# Patient Record
Sex: Female | Born: 1975
Health system: Southern US, Community
[De-identification: ages and names within clinical notes are randomized; demographics above are authoritative.]

## PROBLEM LIST (undated history)

## (undated) DIAGNOSIS — E119 Type 2 diabetes mellitus without complications: Secondary | ICD-10-CM

---

## 1998-07-10 ENCOUNTER — Ambulatory Visit (HOSPITAL_COMMUNITY): Admission: RE | Admit: 1998-07-10 | Discharge: 1998-07-10 | Payer: Self-pay | Admitting: Obstetrics

## 1998-11-15 ENCOUNTER — Inpatient Hospital Stay (HOSPITAL_COMMUNITY): Admission: AD | Admit: 1998-11-15 | Discharge: 1998-11-17 | Payer: Self-pay | Admitting: *Deleted

## 1998-12-08 ENCOUNTER — Inpatient Hospital Stay (HOSPITAL_COMMUNITY): Admission: AD | Admit: 1998-12-08 | Discharge: 1998-12-08 | Payer: Self-pay | Admitting: Obstetrics

## 2019-10-19 DIAGNOSIS — R05 Cough: Secondary | ICD-10-CM

## 2019-10-19 DIAGNOSIS — R059 Cough, unspecified: Secondary | ICD-10-CM

## 2019-10-19 DIAGNOSIS — K8020 Calculus of gallbladder without cholecystitis without obstruction: Secondary | ICD-10-CM

## 2019-10-19 HISTORY — DX: Cough: R05

## 2019-10-19 HISTORY — DX: Calculus of gallbladder without cholecystitis without obstruction: K80.20

## 2019-10-19 HISTORY — DX: Cough, unspecified: R05.9

## 2019-10-25 ENCOUNTER — Inpatient Hospital Stay (HOSPITAL_COMMUNITY)
Admission: EM | Admit: 2019-10-25 | Discharge: 2019-11-02 | DRG: 853 | Disposition: A | Discharge status: Home / Self Care | Payer: Self-pay | Attending: General Surgery | Admitting: General Surgery

## 2019-10-25 ENCOUNTER — Other Ambulatory Visit: Payer: Self-pay

## 2019-10-25 ENCOUNTER — Encounter (HOSPITAL_COMMUNITY): Payer: Self-pay | Admitting: Emergency Medicine

## 2019-10-25 DIAGNOSIS — D500 Iron deficiency anemia secondary to blood loss (chronic): Secondary | ICD-10-CM | POA: Diagnosis present

## 2019-10-25 DIAGNOSIS — E1165 Type 2 diabetes mellitus with hyperglycemia: Secondary | ICD-10-CM | POA: Diagnosis present

## 2019-10-25 DIAGNOSIS — Z6838 Body mass index (BMI) 38.0-38.9, adult: Secondary | ICD-10-CM

## 2019-10-25 DIAGNOSIS — M726 Necrotizing fasciitis: Secondary | ICD-10-CM | POA: Diagnosis present

## 2019-10-25 DIAGNOSIS — K8050 Calculus of bile duct without cholangitis or cholecystitis without obstruction: Secondary | ICD-10-CM

## 2019-10-25 DIAGNOSIS — A409 Streptococcal sepsis, unspecified: Principal | ICD-10-CM | POA: Diagnosis present

## 2019-10-25 DIAGNOSIS — E669 Obesity, unspecified: Secondary | ICD-10-CM | POA: Diagnosis present

## 2019-10-25 DIAGNOSIS — D509 Iron deficiency anemia, unspecified: Secondary | ICD-10-CM | POA: Diagnosis present

## 2019-10-25 DIAGNOSIS — E876 Hypokalemia: Secondary | ICD-10-CM | POA: Diagnosis not present

## 2019-10-25 DIAGNOSIS — K8070 Calculus of gallbladder and bile duct without cholecystitis without obstruction: Secondary | ICD-10-CM | POA: Diagnosis present

## 2019-10-25 DIAGNOSIS — R059 Cough, unspecified: Secondary | ICD-10-CM

## 2019-10-25 DIAGNOSIS — E871 Hypo-osmolality and hyponatremia: Secondary | ICD-10-CM | POA: Diagnosis present

## 2019-10-25 DIAGNOSIS — I96 Gangrene, not elsewhere classified: Secondary | ICD-10-CM

## 2019-10-25 DIAGNOSIS — N393 Stress incontinence (female) (male): Secondary | ICD-10-CM | POA: Diagnosis present

## 2019-10-25 DIAGNOSIS — L02215 Cutaneous abscess of perineum: Secondary | ICD-10-CM | POA: Diagnosis present

## 2019-10-25 DIAGNOSIS — K219 Gastro-esophageal reflux disease without esophagitis: Secondary | ICD-10-CM | POA: Diagnosis present

## 2019-10-25 DIAGNOSIS — Z9114 Patient's other noncompliance with medication regimen: Secondary | ICD-10-CM

## 2019-10-25 DIAGNOSIS — Z20828 Contact with and (suspected) exposure to other viral communicable diseases: Secondary | ICD-10-CM | POA: Diagnosis present

## 2019-10-25 DIAGNOSIS — L03116 Cellulitis of left lower limb: Secondary | ICD-10-CM | POA: Diagnosis present

## 2019-10-25 DIAGNOSIS — R05 Cough: Secondary | ICD-10-CM

## 2019-10-25 DIAGNOSIS — Z713 Dietary counseling and surveillance: Secondary | ICD-10-CM

## 2019-10-25 DIAGNOSIS — E119 Type 2 diabetes mellitus without complications: Secondary | ICD-10-CM

## 2019-10-25 DIAGNOSIS — A419 Sepsis, unspecified organism: Secondary | ICD-10-CM | POA: Diagnosis present

## 2019-10-25 DIAGNOSIS — K8020 Calculus of gallbladder without cholecystitis without obstruction: Secondary | ICD-10-CM

## 2019-10-25 DIAGNOSIS — J189 Pneumonia, unspecified organism: Secondary | ICD-10-CM | POA: Diagnosis not present

## 2019-10-25 HISTORY — DX: Type 2 diabetes mellitus without complications: E11.9

## 2019-10-25 LAB — CBC
HCT: 31.4 % — ABNORMAL LOW (ref 36.0–46.0)
Hemoglobin: 9.5 g/dL — ABNORMAL LOW (ref 12.0–15.0)
MCH: 22.9 pg — ABNORMAL LOW (ref 26.0–34.0)
MCHC: 30.3 g/dL (ref 30.0–36.0)
MCV: 75.7 fL — ABNORMAL LOW (ref 80.0–100.0)
Platelets: 348 10*3/uL (ref 150–400)
RBC: 4.15 MIL/uL (ref 3.87–5.11)
RDW: 17.4 % — ABNORMAL HIGH (ref 11.5–15.5)
WBC: 22 10*3/uL — ABNORMAL HIGH (ref 4.0–10.5)
nRBC: 0 % (ref 0.0–0.2)

## 2019-10-25 LAB — BASIC METABOLIC PANEL
Anion gap: 13 (ref 5–15)
BUN: 13 mg/dL (ref 6–20)
CO2: 22 mmol/L (ref 22–32)
Calcium: 8.7 mg/dL — ABNORMAL LOW (ref 8.9–10.3)
Chloride: 96 mmol/L — ABNORMAL LOW (ref 98–111)
Creatinine, Ser: 0.68 mg/dL (ref 0.44–1.00)
GFR calc Af Amer: >60 mL/min (ref >60)
GFR calc non Af Amer: >60 mL/min (ref >60)
Glucose, Bld: 320 mg/dL — ABNORMAL HIGH (ref 70–99)
Potassium: 3.8 mmol/L (ref 3.5–5.1)
Sodium: 131 mmol/L — ABNORMAL LOW (ref 135–145)

## 2019-10-25 LAB — I-STAT BETA HCG BLOOD, ED (MC, WL, AP ONLY): I-stat hCG, quantitative: <5 m[IU]/mL (ref <5)

## 2019-10-25 NOTE — ED Triage Notes (Signed)
Patient with nausea and vomiting and high blood sugar.  Patient has Type II diabetes, she is also having some dizziness when standing.  Patient with pain in her abdomen.

## 2019-10-26 ENCOUNTER — Encounter (HOSPITAL_COMMUNITY): Payer: Self-pay | Admitting: Radiology

## 2019-10-26 ENCOUNTER — Observation Stay (HOSPITAL_COMMUNITY): Payer: Self-pay | Admitting: Critical Care Medicine

## 2019-10-26 ENCOUNTER — Encounter (HOSPITAL_COMMUNITY): Admission: EM | Disposition: A | Discharge status: Home / Self Care | Payer: Self-pay | Source: Home / Self Care

## 2019-10-26 ENCOUNTER — Emergency Department (HOSPITAL_COMMUNITY): Payer: Self-pay

## 2019-10-26 ENCOUNTER — Other Ambulatory Visit: Payer: Self-pay

## 2019-10-26 DIAGNOSIS — M726 Necrotizing fasciitis: Secondary | ICD-10-CM

## 2019-10-26 DIAGNOSIS — E119 Type 2 diabetes mellitus without complications: Secondary | ICD-10-CM

## 2019-10-26 HISTORY — PX: INCISION AND DRAINAGE PERIRECTAL ABSCESS: SHX1804

## 2019-10-26 LAB — GLUCOSE, CAPILLARY
Glucose-Capillary: 293 mg/dL — ABNORMAL HIGH (ref 70–99)
Glucose-Capillary: 290 mg/dL — ABNORMAL HIGH (ref 70–99)
Glucose-Capillary: 322 mg/dL — ABNORMAL HIGH (ref 70–99)
Glucose-Capillary: 286 mg/dL — ABNORMAL HIGH (ref 70–99)

## 2019-10-26 LAB — CBG MONITORING, ED: Glucose-Capillary: 339 mg/dL — ABNORMAL HIGH (ref 70–99)

## 2019-10-26 LAB — URINALYSIS, ROUTINE W REFLEX MICROSCOPIC
Bilirubin Urine: NEGATIVE
Glucose, UA: >=500 mg/dL — AB
Ketones, ur: 80 mg/dL — AB
Leukocytes,Ua: NEGATIVE
Nitrite: NEGATIVE
Protein, ur: 30 mg/dL — AB
Specific Gravity, Urine: >1.046 — ABNORMAL HIGH (ref 1.005–1.030)
pH: 5 (ref 5.0–8.0)

## 2019-10-26 LAB — HEMOGLOBIN A1C
Hgb A1c MFr Bld: 11.8 % — ABNORMAL HIGH (ref 4.8–5.6)
Mean Plasma Glucose: 291.96 mg/dL

## 2019-10-26 LAB — HEPATIC FUNCTION PANEL
ALT: 21 U/L (ref 0–44)
AST: 53 U/L — ABNORMAL HIGH (ref 15–41)
Albumin: 3 g/dL — ABNORMAL LOW (ref 3.5–5.0)
Alkaline Phosphatase: 102 U/L (ref 38–126)
Bilirubin, Direct: 0.6 mg/dL — ABNORMAL HIGH (ref 0.0–0.2)
Indirect Bilirubin: 1.1 mg/dL — ABNORMAL HIGH (ref 0.3–0.9)
Total Bilirubin: 1.7 mg/dL — ABNORMAL HIGH (ref 0.3–1.2)
Total Protein: 6.9 g/dL (ref 6.5–8.1)

## 2019-10-26 LAB — RESPIRATORY PANEL BY RT PCR (FLU A&B, COVID)
Influenza A by PCR: NEGATIVE
Influenza B by PCR: NEGATIVE
SARS Coronavirus 2 by RT PCR: NEGATIVE

## 2019-10-26 LAB — HIV ANTIBODY (ROUTINE TESTING W REFLEX): HIV Screen 4th Generation wRfx: NONREACTIVE

## 2019-10-26 LAB — LACTIC ACID, PLASMA
Lactic Acid, Venous: 2.7 mmol/L (ref 0.5–1.9)
Lactic Acid, Venous: 2 mmol/L (ref 0.5–1.9)

## 2019-10-26 LAB — LIPASE, BLOOD: Lipase: 17 U/L (ref 11–51)

## 2019-10-26 SURGERY — INCISION AND DRAINAGE, ABSCESS, PERIRECTAL
Anesthesia: General | Site: Groin | Laterality: Left

## 2019-10-26 MED ORDER — DIPHENHYDRAMINE HCL 25 MG PO CAPS: 25.0000 mg | ORAL_CAPSULE | Freq: Four times a day (QID) | ORAL | Status: DC | PRN

## 2019-10-26 MED ORDER — PHENYLEPHRINE 40 MCG/ML (10ML) SYRINGE FOR IV PUSH (FOR BLOOD PRESSURE SUPPORT)
PREFILLED_SYRINGE | INTRAVENOUS | Status: DC | PRN
  Administered 2019-10-26: 80 ug via INTRAVENOUS
  Administered 2019-10-26: 200 ug via INTRAVENOUS
  Administered 2019-10-26 (×2): 80 ug via INTRAVENOUS

## 2019-10-26 MED ORDER — EPHEDRINE 5 MG/ML INJ
INTRAVENOUS | Status: AC
  Filled 2019-10-26: qty 10

## 2019-10-26 MED ORDER — ONDANSETRON HCL 4 MG/2ML IJ SOLN
INTRAMUSCULAR | Status: DC | PRN
  Administered 2019-10-26: 4 mg via INTRAVENOUS

## 2019-10-26 MED ORDER — VANCOMYCIN HCL 10 G IV SOLR
1250.0000 mg | INTRAVENOUS | Status: DC
  Filled 2019-10-26: qty 1250

## 2019-10-26 MED ORDER — ONDANSETRON HCL 4 MG/2ML IJ SOLN
4.0000 mg | Freq: Once | INTRAMUSCULAR | Status: AC
  Administered 2019-10-26: 4 mg via INTRAVENOUS
  Filled 2019-10-26: qty 2

## 2019-10-26 MED ORDER — MIDAZOLAM HCL 2 MG/2ML IJ SOLN
INTRAMUSCULAR | Status: AC
  Filled 2019-10-26: qty 2

## 2019-10-26 MED ORDER — ONDANSETRON HCL 4 MG/2ML IJ SOLN
INTRAMUSCULAR | Status: AC
  Filled 2019-10-26: qty 4

## 2019-10-26 MED ORDER — PIPERACILLIN-TAZOBACTAM 3.375 G IVPB 30 MIN: 3.3750 g | Freq: Three times a day (TID) | INTRAVENOUS | Status: DC

## 2019-10-26 MED ORDER — MORPHINE SULFATE (PF) 2 MG/ML IV SOLN: 1.0000 mg | INTRAVENOUS | Status: DC | PRN

## 2019-10-26 MED ORDER — PHENYLEPHRINE 40 MCG/ML (10ML) SYRINGE FOR IV PUSH (FOR BLOOD PRESSURE SUPPORT)
PREFILLED_SYRINGE | INTRAVENOUS | Status: AC
  Filled 2019-10-26: qty 20

## 2019-10-26 MED ORDER — ACETAMINOPHEN 500 MG PO TABS
1000.0000 mg | ORAL_TABLET | Freq: Once | ORAL | Status: AC
  Administered 2019-10-26: 1000 mg via ORAL
  Filled 2019-10-26: qty 2

## 2019-10-26 MED ORDER — PIPERACILLIN-TAZOBACTAM 3.375 G IVPB
3.3750 g | Freq: Three times a day (TID) | INTRAVENOUS | Status: DC
  Filled 2019-10-26: qty 50

## 2019-10-26 MED ORDER — PHENYLEPHRINE HCL-NACL 10-0.9 MG/250ML-% IV SOLN
INTRAVENOUS | Status: DC | PRN
  Administered 2019-10-26: 50 ug/min via INTRAVENOUS

## 2019-10-26 MED ORDER — SUCCINYLCHOLINE CHLORIDE 200 MG/10ML IV SOSY
PREFILLED_SYRINGE | INTRAVENOUS | Status: AC
  Filled 2019-10-26: qty 20

## 2019-10-26 MED ORDER — PIPERACILLIN-TAZOBACTAM 3.375 G IVPB
3.3750 g | Freq: Three times a day (TID) | INTRAVENOUS | Status: DC
  Administered 2019-10-26 – 2019-11-01 (×19): 3.375 g via INTRAVENOUS
  Filled 2019-10-26 (×16): qty 50

## 2019-10-26 MED ORDER — INSULIN DETEMIR 100 UNIT/ML ~~LOC~~ SOLN
12.0000 [IU] | Freq: Every day | SUBCUTANEOUS | Status: DC
  Administered 2019-10-26: 12 [IU] via SUBCUTANEOUS
  Filled 2019-10-26 (×2): qty 0.12

## 2019-10-26 MED ORDER — PROPOFOL 10 MG/ML IV BOLUS
INTRAVENOUS | Status: AC
  Filled 2019-10-26: qty 20

## 2019-10-26 MED ORDER — VANCOMYCIN HCL IN DEXTROSE 1-5 GM/200ML-% IV SOLN
1000.0000 mg | Freq: Once | INTRAVENOUS | Status: AC
  Administered 2019-10-26: 1000 mg via INTRAVENOUS
  Filled 2019-10-26: qty 200

## 2019-10-26 MED ORDER — OXYCODONE HCL 5 MG PO TABS: 5.0000 mg | ORAL_TABLET | Freq: Once | ORAL | Status: DC | PRN

## 2019-10-26 MED ORDER — ONDANSETRON HCL 4 MG/2ML IJ SOLN
4.0000 mg | Freq: Four times a day (QID) | INTRAMUSCULAR | Status: DC | PRN
  Administered 2019-10-26 – 2019-10-31 (×3): 4 mg via INTRAVENOUS
  Filled 2019-10-26 (×5): qty 2

## 2019-10-26 MED ORDER — SCOPOLAMINE 1 MG/3DAYS TD PT72
1.0000 | MEDICATED_PATCH | Freq: Once | TRANSDERMAL | Status: AC
  Administered 2019-10-26: 1.5 mg via TRANSDERMAL
  Filled 2019-10-26: qty 1

## 2019-10-26 MED ORDER — DEXAMETHASONE SODIUM PHOSPHATE 10 MG/ML IJ SOLN
INTRAMUSCULAR | Status: AC
  Filled 2019-10-26: qty 1

## 2019-10-26 MED ORDER — INSULIN ASPART 100 UNIT/ML ~~LOC~~ SOLN
0.0000 [IU] | Freq: Three times a day (TID) | SUBCUTANEOUS | Status: DC
  Administered 2019-10-26: 18:00:00 7 [IU] via SUBCUTANEOUS
  Administered 2019-10-27: 2 [IU] via SUBCUTANEOUS
  Administered 2019-10-27: 1 [IU] via SUBCUTANEOUS
  Administered 2019-10-27: 09:00:00 3 [IU] via SUBCUTANEOUS
  Administered 2019-10-28 – 2019-11-01 (×9): 1 [IU] via SUBCUTANEOUS
  Administered 2019-11-02: 2 [IU] via SUBCUTANEOUS

## 2019-10-26 MED ORDER — FENTANYL CITRATE (PF) 250 MCG/5ML IJ SOLN
INTRAMUSCULAR | Status: DC | PRN
  Administered 2019-10-26 (×2): 50 ug via INTRAVENOUS
  Administered 2019-10-26: 25 ug via INTRAVENOUS

## 2019-10-26 MED ORDER — SODIUM CHLORIDE 0.9 % IV BOLUS: 1000.0000 mL | Freq: Once | INTRAVENOUS | Status: AC

## 2019-10-26 MED ORDER — LIDOCAINE 2% (20 MG/ML) 5 ML SYRINGE
INTRAMUSCULAR | Status: AC
  Filled 2019-10-26: qty 15

## 2019-10-26 MED ORDER — PROMETHAZINE HCL 25 MG/ML IJ SOLN: 6.2500 mg | INTRAMUSCULAR | Status: DC | PRN

## 2019-10-26 MED ORDER — SODIUM CHLORIDE 0.9 % IV BOLUS
1000.0000 mL | Freq: Once | INTRAVENOUS | Status: AC
  Administered 2019-10-26: 1000 mL via INTRAVENOUS

## 2019-10-26 MED ORDER — FENTANYL CITRATE (PF) 250 MCG/5ML IJ SOLN
INTRAMUSCULAR | Status: AC
  Filled 2019-10-26: qty 5

## 2019-10-26 MED ORDER — MORPHINE SULFATE (PF) 4 MG/ML IV SOLN
4.0000 mg | Freq: Once | INTRAVENOUS | Status: AC
  Administered 2019-10-26: 4 mg via INTRAVENOUS
  Filled 2019-10-26: qty 1

## 2019-10-26 MED ORDER — PIPERACILLIN-TAZOBACTAM 3.375 G IVPB 30 MIN
3.3750 g | Freq: Once | INTRAVENOUS | Status: AC
  Administered 2019-10-26: 3.375 g via INTRAVENOUS
  Filled 2019-10-26: qty 50

## 2019-10-26 MED ORDER — ROCURONIUM BROMIDE 10 MG/ML (PF) SYRINGE
PREFILLED_SYRINGE | INTRAVENOUS | Status: AC
  Filled 2019-10-26: qty 10

## 2019-10-26 MED ORDER — PROPOFOL 10 MG/ML IV BOLUS
INTRAVENOUS | Status: DC | PRN
  Administered 2019-10-26: 150 mg via INTRAVENOUS

## 2019-10-26 MED ORDER — DIPHENHYDRAMINE HCL 50 MG/ML IJ SOLN: 25.0000 mg | Freq: Four times a day (QID) | INTRAMUSCULAR | Status: DC | PRN

## 2019-10-26 MED ORDER — SUCCINYLCHOLINE CHLORIDE 200 MG/10ML IV SOSY
PREFILLED_SYRINGE | INTRAVENOUS | Status: DC | PRN
  Administered 2019-10-26: 120 mg via INTRAVENOUS

## 2019-10-26 MED ORDER — ARTIFICIAL TEARS OPHTHALMIC OINT
TOPICAL_OINTMENT | OPHTHALMIC | Status: AC
  Filled 2019-10-26: qty 3.5

## 2019-10-26 MED ORDER — FENTANYL CITRATE (PF) 100 MCG/2ML IJ SOLN: 25.0000 ug | INTRAMUSCULAR | Status: DC | PRN

## 2019-10-26 MED ORDER — OXYCODONE HCL 5 MG PO TABS
5.0000 mg | ORAL_TABLET | ORAL | Status: DC | PRN
  Administered 2019-10-27 – 2019-10-28 (×2): 5 mg via ORAL
  Filled 2019-10-26 (×2): qty 1
  Filled 2019-10-26: qty 2
  Filled 2019-10-26: qty 1

## 2019-10-26 MED ORDER — GLYCOPYRROLATE PF 0.2 MG/ML IJ SOSY
PREFILLED_SYRINGE | INTRAMUSCULAR | Status: AC
  Filled 2019-10-26: qty 1

## 2019-10-26 MED ORDER — INSULIN ASPART 100 UNIT/ML ~~LOC~~ SOLN
5.0000 [IU] | Freq: Once | SUBCUTANEOUS | Status: AC
  Administered 2019-10-26: 13:00:00 5 [IU] via SUBCUTANEOUS

## 2019-10-26 MED ORDER — METHOCARBAMOL 500 MG PO TABS
500.0000 mg | ORAL_TABLET | Freq: Three times a day (TID) | ORAL | Status: DC | PRN
  Administered 2019-10-27 – 2019-10-28 (×2): 500 mg via ORAL
  Filled 2019-10-26 (×3): qty 1

## 2019-10-26 MED ORDER — ONDANSETRON 4 MG PO TBDP: 4.0000 mg | ORAL_TABLET | Freq: Four times a day (QID) | ORAL | Status: DC | PRN

## 2019-10-26 MED ORDER — IOHEXOL 300 MG/ML  SOLN
100.0000 mL | Freq: Once | INTRAMUSCULAR | Status: AC | PRN
  Administered 2019-10-26: 100 mL via INTRAVENOUS

## 2019-10-26 MED ORDER — OXYCODONE HCL 5 MG/5ML PO SOLN: 5.0000 mg | Freq: Once | ORAL | Status: DC | PRN

## 2019-10-26 MED ORDER — INSULIN ASPART 100 UNIT/ML ~~LOC~~ SOLN
SUBCUTANEOUS | Status: AC
  Administered 2019-10-26: 5 [IU] via SUBCUTANEOUS
  Filled 2019-10-26: qty 1

## 2019-10-26 MED ORDER — 0.9 % SODIUM CHLORIDE (POUR BTL) OPTIME
TOPICAL | Status: DC | PRN
  Administered 2019-10-26: 1000 mL

## 2019-10-26 MED ORDER — MORPHINE SULFATE (PF) 2 MG/ML IV SOLN
1.0000 mg | INTRAVENOUS | Status: DC | PRN
  Administered 2019-10-26: 2 mg via INTRAVENOUS
  Filled 2019-10-26: qty 1

## 2019-10-26 MED ORDER — ACETAMINOPHEN 325 MG PO TABS
650.0000 mg | ORAL_TABLET | Freq: Four times a day (QID) | ORAL | Status: DC | PRN
  Administered 2019-10-27: 650 mg via ORAL
  Filled 2019-10-26: qty 2

## 2019-10-26 MED ORDER — LACTATED RINGERS IV SOLN
INTRAVENOUS | Status: DC
  Administered 2019-10-26: 11:00:00 via INTRAVENOUS

## 2019-10-26 MED ORDER — INSULIN ASPART 100 UNIT/ML ~~LOC~~ SOLN
5.0000 [IU] | Freq: Once | SUBCUTANEOUS | Status: AC
  Administered 2019-10-26: 5 [IU] via SUBCUTANEOUS

## 2019-10-26 MED ORDER — SODIUM CHLORIDE 0.9 % IV SOLN
INTRAVENOUS | Status: DC
  Administered 2019-10-26: 14:00:00 via INTRAVENOUS

## 2019-10-26 MED ORDER — LIDOCAINE 2% (20 MG/ML) 5 ML SYRINGE
INTRAMUSCULAR | Status: DC | PRN
  Administered 2019-10-26: 60 mg via INTRAVENOUS

## 2019-10-26 MED ORDER — MIDAZOLAM HCL 5 MG/5ML IJ SOLN
INTRAMUSCULAR | Status: DC | PRN
  Administered 2019-10-26 (×2): 1 mg via INTRAVENOUS

## 2019-10-26 MED ORDER — LACTATED RINGERS IV BOLUS
1000.0000 mL | Freq: Once | INTRAVENOUS | Status: AC
  Administered 2019-10-26: 08:00:00 1000 mL via INTRAVENOUS

## 2019-10-26 SURGICAL SUPPLY — 31 items
BNDG GAUZE ELAST 4 BULKY (GAUZE/BANDAGES/DRESSINGS) IMPLANT
COVER MAYO STAND STRL (DRAPES) ×3 IMPLANT
COVER SURGICAL LIGHT HANDLE (MISCELLANEOUS) ×3 IMPLANT
COVER WAND RF STERILE (DRAPES) ×3 IMPLANT
ELECT CAUTERY BLADE 6.4 (BLADE) ×3 IMPLANT
ELECT REM PT RETURN 9FT ADLT (ELECTROSURGICAL) ×3
ELECTRODE REM PT RTRN 9FT ADLT (ELECTROSURGICAL) ×1 IMPLANT
GAUZE SPONGE 4X4 12PLY STRL (GAUZE/BANDAGES/DRESSINGS) ×3 IMPLANT
GLOVE BIO SURGEON STRL SZ7 (GLOVE) ×3 IMPLANT
GLOVE BIOGEL PI IND STRL 7.5 (GLOVE) ×1 IMPLANT
GLOVE BIOGEL PI INDICATOR 7.5 (GLOVE) ×2
GOWN STRL REUS W/ TWL LRG LVL3 (GOWN DISPOSABLE) ×3 IMPLANT
GOWN STRL REUS W/TWL LRG LVL3 (GOWN DISPOSABLE) ×6
KIT BASIN OR (CUSTOM PROCEDURE TRAY) ×3 IMPLANT
KIT TURNOVER KIT B (KITS) ×3 IMPLANT
NS IRRIG 1000ML POUR BTL (IV SOLUTION) ×3 IMPLANT
PACK GENERAL/GYN (CUSTOM PROCEDURE TRAY) ×3 IMPLANT
PACK LITHOTOMY IV (CUSTOM PROCEDURE TRAY) IMPLANT
PAD ARMBOARD 7.5X6 YLW CONV (MISCELLANEOUS) ×3 IMPLANT
PENCIL SMOKE EVACUATOR (MISCELLANEOUS) ×3 IMPLANT
SPONGE LAP 18X18 RF (DISPOSABLE) ×3 IMPLANT
SURGILUBE 2OZ TUBE FLIPTOP (MISCELLANEOUS) ×3 IMPLANT
SWAB CULTURE LIQ STUART DBL (MISCELLANEOUS) ×3 IMPLANT
SWAB CULTURE LIQUID MINI MALE (MISCELLANEOUS) ×3 IMPLANT
SYR BULB 3OZ (MISCELLANEOUS) IMPLANT
THERMADRAPE LEGGINGS (DRAPES) ×3 IMPLANT
TOWEL GREEN STERILE (TOWEL DISPOSABLE) ×3 IMPLANT
TOWEL GREEN STERILE FF (TOWEL DISPOSABLE) ×3 IMPLANT
TUBE CONNECTING 12'X1/4 (SUCTIONS) ×1
TUBE CONNECTING 12X1/4 (SUCTIONS) ×2 IMPLANT
YANKAUER SUCT BULB TIP NO VENT (SUCTIONS) ×3 IMPLANT

## 2019-10-26 NOTE — Transfer of Care (Signed)
Immediate Anesthesia Transfer of Care Note  Patient: Morgan Williamson  Procedure(s) Performed: INCISION AND DRAINAGE GROIN (Left Groin)  Patient Location: PACU  Anesthesia Type:General  Level of Consciousness: awake and alert   Airway & Oxygen Therapy: Patient Spontanous Breathing and Patient connected to nasal cannula oxygen  Post-op Assessment: Report given to RN and Post -op Vital signs reviewed and stable  Post vital signs: Reviewed and stable  Last Vitals:  Vitals Value Taken Time  BP 89/62 10/26/19 1226  Temp    Pulse 111 10/26/19 1226  Resp 20 10/26/19 1226  SpO2 93 % 10/26/19 1226    Last Pain:  Vitals:   10/26/19 0915  TempSrc:   PainSc: Asleep         Complications: No apparent anesthesia complications

## 2019-10-26 NOTE — Progress Notes (Signed)
Pt admitted to 6N26 from PACU.  Assisted to BR and pt was able to void.  SCDs placed.

## 2019-10-26 NOTE — Progress Notes (Signed)
Pharmacy Antibiotic Note  Morgan Williamson is a 43 y.o. female admitted on 10/25/2019 with necrotizing fascitis of left groin.  Pharmacy has been consulted for Vancomycin  dosing.  S/p I + D this AM Vancomycin 1 gram given at 7 am Also, receiving Zosyn  Plan: Vancomycin 1250 mg iv Q 24 hours starting 12/9 AM Follow up Scr, cultures, progress  Height: 4\' 9"  (144.8 cm) Weight: 180 lb (81.6 kg) IBW/kg (Calculated) : 38.6  Temp (24hrs), Avg:99.6 F (37.6 C), Min:99 F (37.2 C), Max:100.3 F (37.9 C)  Recent Labs  Lab 10/25/19 2205 10/26/19 0454 10/26/19 0911  WBC 22.0*  --   --   CREATININE 0.68  --   --   LATICACIDVEN  --  2.7* 2.0*    Estimated Creatinine Clearance: 81.5 mL/min (by C-G formula based on SCr of 0.68 mg/dL).    No Known Allergies  Thank you for allowing pharmacy to be a part of this patient's care.  Tad Moore 10/26/2019 2:08 PM

## 2019-10-26 NOTE — Anesthesia Procedure Notes (Signed)
Procedure Name: Intubation Date/Time: 10/26/2019 11:44 AM Performed by: Wilburn Cornelia, CRNA Pre-anesthesia Checklist: Emergency Drugs available, Patient identified, Suction available, Patient being monitored and Timeout performed Patient Re-evaluated:Patient Re-evaluated prior to induction Oxygen Delivery Method: Circle system utilized Preoxygenation: Pre-oxygenation with 100% oxygen Induction Type: IV induction and Rapid sequence Laryngoscope Size: Mac and 3 Grade View: Grade I Tube type: Oral Tube size: 7.0 mm Number of attempts: 1 Airway Equipment and Method: Stylet Placement Confirmation: ETT inserted through vocal cords under direct vision,  positive ETCO2,  CO2 detector and breath sounds checked- equal and bilateral Secured at: 21 cm Tube secured with: Tape Dental Injury: Teeth and Oropharynx as per pre-operative assessment

## 2019-10-26 NOTE — ED Notes (Signed)
Triage RN made aware of pts b/p 

## 2019-10-26 NOTE — Anesthesia Preprocedure Evaluation (Addendum)
Anesthesia Evaluation  Patient identified by MRN, date of birth, ID band Patient awake    Reviewed: Allergy & Precautions, NPO status , Patient's Chart, lab work & pertinent test results  History of Anesthesia Complications Negative for: history of anesthetic complications  Airway Mallampati: II  TM Distance: >3 FB Neck ROM: Full    Dental  (+) Dental Advisory Given, Teeth Intact   Pulmonary neg pulmonary ROS,    Pulmonary exam normal        Cardiovascular negative cardio ROS Normal cardiovascular exam     Neuro/Psych negative neurological ROS  negative psych ROS   GI/Hepatic Neg liver ROS,   Endo/Other  diabetes, Poorly Controlled, Type 2Morbid obesity Hyponatremia   Renal/GU negative Renal ROS  negative genitourinary   Musculoskeletal  Necrotizing fasciitis of groin   Abdominal   Peds  Hematology  (+) anemia , Hgb 9.5   Anesthesia Other Findings   Reproductive/Obstetrics negative OB ROS                           Anesthesia Physical Anesthesia Plan  ASA: III  Anesthesia Plan: General   Post-op Pain Management:    Induction: Intravenous and Rapid sequence  PONV Risk Score and Plan: 4 or greater and Treatment may vary due to age or medical condition, Ondansetron, Dexamethasone, Midazolam and Scopolamine patch - Pre-op  Airway Management Planned: Oral ETT  Additional Equipment: None  Intra-op Plan:   Post-operative Plan: Extubation in OR  Informed Consent: I have reviewed the patients History and Physical, chart, labs and discussed the procedure including the risks, benefits and alternatives for the proposed anesthesia with the patient or authorized representative who has indicated his/her understanding and acceptance.     Dental advisory given  Plan Discussed with: CRNA and Anesthesiologist  Anesthesia Plan Comments:       Anesthesia Quick Evaluation

## 2019-10-26 NOTE — Significant Event (Signed)
Rapid Response Event Note  Overview: Time Called: 1546 Event Type: MEWS  Initial Focused Assessment: Called to bedside for MEWs score related to patient's HR and BP. Patient tachycardic at and BP 100/69. Patient is lethargic, drowsy. Patient just arrived from PACU following I&D. Patient had received 2mg  Morphine at 1354.   Upon my assessment, patient had completed 1L bolus. Patient was awake, alert, lying in bed. BP 117/69, HR 121. Lungs auscultated and clear in all quadrants.  Interventions: Dr. Linda Hedges at bedside to assess patient and ordered 1L bolus.  Primary RN also administered PRN Tylenol for pain.  Plan of Care (if not transferred): I spoke with Noralee Stain., RN. She states that since administration of the 1L bolus, patient looks better and her vital signs have improved. HR remains tachycardic at 115 bpm, RN to continue to monitor per MEWs protocol.  Event Summary:  Morgan Williamson

## 2019-10-26 NOTE — Consult Note (Addendum)
[facility] Medical Consultation  Morgan Williamson BSJ:[id number] DOB: Aug 13, 1976 DOA: 10/25/2019 PCP: System, Pcp Not In   Requesting physician: Morgan Williamson Date of consultation: 10/26/2019 Reason for consultation: medical management/diabetes  Impression/Recommendations Active Problems:   Necrotizing fasciitis ([place])    1. Necrotzing fasciitis - patient s/p debridement of perineal abscess left. Care of wound per GS 2. DM - not taking meds. A1C 11.8%. Continue basal insulin and sliding scale. Will need outpatient f/u: IM clinic or [facility] Department Primary care clinic.   I will followup again tomorrow. Please contact me if I can be of assistance in the meanwhile. Thank you for this consultation.  Chief Complaint: Abd pain with N/V  HPI:  Morgan Williamson is a 43 y/o presenting with  RUQ abdominal pain x1 day and L groin "boil" x1 week. She reports associated n/v and constipation, but no fevers at home. She is actively vomiting at the time of my exam. She carries a diagnosis of diabetes, but reports not taking her medications which she describes as pills only, no insulin. History is obtained using a spanish interpreter. Her evaluation to date includes CT abd/pelvis with cholelithiasis but no cholecystitis, unremarkable liver functions, lipase. CBC with leukocytosis of 22. She has undergone I&D and excision of perineal abscess left.  Review of Systems:  CV- denies c/p Resp - no SOB, no cough GI - continue mild nausea, minimal pain, no diarrhea Gyn- no c/o MSK - no joint pain or problems Neuro - no loss of sensation, no change in mentation Derm - per HPI  Past Medical History:  Diagnosis Date  . Diabetes mellitus without complication ([place])    History reviewed. No pertinent surgical history.   Social History:  has no history on file for tobacco, alcohol, and drug.  Spanish speaking immigrant. Lives with husband and two children. Husband is employed. She is full-time Materials engineer.  No  Known Allergies Family History  Problem Relation Age of Onset  . Diabetes Mother     Prior to Admission medications   Not on File   Physical Exam: Blood pressure 108/71, pulse (!) 117, temperature 99 F (37.2 C), resp. rate 20, height 4\' 9"  (1.448 m), weight 81.6 kg, SpO2 94 %. Vitals:   10/26/19 1257 10/26/19 1312  BP: (!) 101/55 108/71  Pulse: (!) 116 (!) 117  Resp: (!) 22 20  Temp:  99 F (37.2 C)  SpO2: 92% 94%     General:  Obese woman who is somnolent but able to stay awake for exam  Eyes: C&S clear, PERRLA  ENT: No oral lesions, native dentition in good repair  Neck: no thyromegaly  Cardiovascular: 2+ radial and DP pulse, quiet precordium, RRR, w/o mm/r/g  Respiratory: Normal respirations. Lungs CTAP  Abdomen: Obese, soft, no HSM, hypoactive BS, no guarding or rebound  Skin: Left perineum below vulva with dry dresing in place. No surrounding erythema  Musculoskeletal: No deformity  Psychiatric: somnolent. Calm  Neurologic: CN II-XII grossly normal  Labs on Admission: reviewed Basic Metabolic Panel: Recent Labs  Lab 10/25/19 2205  NA 131*  K 3.8  CL 96*  CO2 22  GLUCOSE 320*  BUN 13  CREATININE 0.68  CALCIUM 8.7*   Liver Function Tests: Recent Labs  Lab 10/26/19 0455  AST 53*  ALT 21  ALKPHOS 102  BILITOT 1.7*  PROT 6.9  ALBUMIN 3.0*   Recent Labs  Lab 10/26/19 0455  LIPASE 17   No results for input(s): AMMONIA in the last 168 hours. CBC:  Recent Labs  Lab 10/25/19 2205  WBC 22.0*  HGB 9.5*  HCT 31.4*  MCV 75.7*  PLT 348   Cardiac Enzymes: No results for input(s): CKTOTAL, CKMB, CKMBINDEX, TROPONINI in the last 168 hours. BNP: Invalid input(s): POCBNP CBG: Recent Labs  Lab 10/26/19 0818 10/26/19 1009 10/26/19 1233  GLUCAP 339* 293* 290*    Radiological Exams on Admission: Ct Abdomen Pelvis W Contrast  Result Date: 10/26/2019 CLINICAL DATA:  Acute generalized abdominal pain EXAM: CT ABDOMEN AND PELVIS WITH  CONTRAST TECHNIQUE: Multidetector CT imaging of the abdomen and pelvis was performed using the standard protocol following bolus administration of intravenous contrast. CONTRAST:  OMNIPAQUE IOHEXOL 300 MG/ML  SOLN COMPARISON:  None. FINDINGS: Lower chest:  No contributory findings. Hepatobiliary: No focal liver abnormality.Cholelithiasis and gallbladder sludge. No evidence of gallbladder inflammation. No bile duct dilatation. Pancreas: Unremarkable. Spleen: Unremarkable. Adrenals/Urinary Tract: Negative adrenals. No hydronephrosis or stone. Unremarkable bladder. Stomach/Bowel:  No obstruction. No appendicitis. Vascular/Lymphatic: No acute vascular abnormality. No mass or adenopathy. Reproductive:No pathologic findings. Other: No ascites or pneumoperitoneum. Soft tissue gas in the visualized left deep groin fat with tracking along the inguinal canal and into the mons pubis/labia, incompletely covered. No organized collection. Musculoskeletal: No acute abnormalities. These results were called by telephone at the time of interpretation on 10/26/2019 at 5:48 am to provider [facility] , who verbally acknowledged these results. IMPRESSION: 1. Partially covered soft tissue gas in the deep fat of the left groin and perineum compatible with necrotizing infection. No visualized abscess ; there is incomplete coverage. 2. Cholelithiasis without signs of cholecystitis. Electronically Signed   By: Marnee Spring M.D.   On: 10/26/2019 05:49    EKG: Independently reviewed. No EKG on file  Time spent: 55 min  Morgan Williamson [facility] Pager [phone]  If 7PM-7AM, please contact night-coverage [email or web address] Password [email or web address] 10/26/2019, 3:09 PM

## 2019-10-26 NOTE — H&P (Signed)
Reason for Consult: necrotizing soft tissue infection of left groin Referring Physician: Wilkie Aye, MD  Morgan Williamson is an 43 y.o. female.   HPI: 38F p/w RUQ abdominal pain x1 day and L groin "boil" x1 week. She reports associated n/v and constipation, but no fevers at home. She is actively vomiting at the time of my exam. She carries a diagnosis of diabetes, but reports not taking her medications which she describes as pills only, no insulin. History is obtained using a spanish interpreter.   Past Medical History:  Diagnosis Date  . Diabetes mellitus without complication ([facility])     History reviewed. No pertinent surgical history.  No family history on file.  Social History:  has no history on file for tobacco, alcohol, and drug.  Allergies: No Known Allergies  Medications: I have reviewed the patient's current medications.  Results for orders placed or performed during the hospital encounter of 10/25/19 (from the past 48 hour(s))  Basic metabolic panel     Status: Abnormal   Collection Time: 10/25/19 10:05 PM  Result Value Ref Range   Sodium 131 (L) 135 - 145 mmol/L   Potassium 3.8 3.5 - 5.1 mmol/L   Chloride 96 (L) 98 - 111 mmol/L   CO2 22 22 - 32 mmol/L   Glucose, Bld 320 (H) 70 - 99 mg/dL   BUN 13 6 - 20 mg/dL   Creatinine, Ser 8.54 0.44 - 1.00 mg/dL   Calcium 8.7 (L) 8.9 - 10.3 mg/dL   GFR calc non Af Amer >60 >60 mL/min   GFR calc Af Amer >60 >60 mL/min   Anion gap 13 5 - 15    Comment: Performed at [facility], [address], [place], [place] [address]  CBC     Status: Abnormal   Collection Time: 10/25/19 10:05 PM  Result Value Ref Range   WBC 22.0 (H) 4.0 - 10.5 K/uL   RBC 4.15 3.87 - 5.11 MIL/uL   Hemoglobin 9.5 (L) 12.0 - 15.0 g/dL   HCT 50.0 (L) 93.8 - 18.2 %   MCV 75.7 (L) 80.0 - 100.0 fL   MCH 22.9 (L) 26.0 - 34.0 pg   MCHC 30.3 30.0 - 36.0 g/dL   RDW 99.3 (H) 71.6 - 96.7 %   Platelets 348 150 - 400 K/uL   nRBC 0.0 0.0 - 0.2 %    Comment:  Performed at [facility], [address] [place], [place] [address]  I-Stat beta hCG blood, ED     Status: None   Collection Time: 10/25/19 10:19 PM  Result Value Ref Range   I-stat hCG, quantitative <5.0 <5 mIU/mL   Comment 3            Comment:   GEST. AGE      CONC.  (mIU/mL)   <=1 WEEK        5 - 50     2 WEEKS       50 - 500     3 WEEKS       100 - 10,000     4 WEEKS     1,000 - 30,000        FEMALE AND NON-PREGNANT FEMALE:     LESS THAN 5 mIU/mL   Lactic acid, plasma     Status: Abnormal   Collection Time: 10/26/19  4:54 AM  Result Value Ref Range   Lactic Acid, Venous 2.7 (HH) 0.5 - 1.9 mmol/L    Comment:  CRITICAL RESULT CALLED TO, READ BACK BY AND VERIFIED WITH: Margarita Sermons 10/26/19 0543 WAYK Performed at [facility] [address] [place], [place] [address]   Hemoglobin A1c     Status: Abnormal   Collection Time: 10/26/19  4:54 AM  Result Value Ref Range   Hgb A1c MFr Bld 11.8 (H) 4.8 - 5.6 %    Comment: (NOTE) Pre diabetes:          5.7%-6.4% Diabetes:              >6.4% Glycemic control for   <7.0% adults with diabetes    Mean Plasma Glucose 291.96 mg/dL    Comment: Performed at [facility] [address], [place], [place] [address]  Hepatic function panel     Status: Abnormal   Collection Time: 10/26/19  4:55 AM  Result Value Ref Range   Total Protein 6.9 6.5 - 8.1 g/dL   Albumin 3.0 (L) 3.5 - 5.0 g/dL   AST 53 (H) 15 - 41 U/L    Comment: SPECIMEN HEMOLYZED. HEMOLYSIS MAY AFFECT INTEGRITY OF RESULTS.   ALT 21 0 - 44 U/L   Alkaline Phosphatase 102 38 - 126 U/L   Total Bilirubin 1.7 (H) 0.3 - 1.2 mg/dL   Bilirubin, Direct 0.6 (H) 0.0 - 0.2 mg/dL   Indirect Bilirubin 1.1 (H) 0.3 - 0.9 mg/dL    Comment: Performed at [facility] [address], [place], [place] [address]  Lipase, blood     Status: None   Collection Time: 10/26/19  4:55 AM  Result Value Ref Range   Lipase 17 11 - 51 U/L    Comment: Performed at [facility] [address] [place], [place] [address]    Ct Abdomen Pelvis W Contrast  Result Date: 10/26/2019 CLINICAL DATA:  Acute generalized abdominal pain EXAM: CT ABDOMEN AND PELVIS WITH CONTRAST TECHNIQUE: Multidetector CT imaging of the abdomen and pelvis was performed using the standard protocol following bolus administration of intravenous contrast. CONTRAST:  118mL OMNIPAQUE IOHEXOL 300 MG/ML  SOLN COMPARISON:  None. FINDINGS: Lower chest:  No contributory findings. Hepatobiliary: No focal liver abnormality.Cholelithiasis and gallbladder sludge. No evidence of gallbladder inflammation. No bile duct dilatation. Pancreas: Unremarkable. Spleen: Unremarkable. Adrenals/Urinary Tract: Negative adrenals. No hydronephrosis or stone. Unremarkable bladder. Stomach/Bowel:  No obstruction. No appendicitis. Vascular/Lymphatic: No acute vascular abnormality. No mass or adenopathy. Reproductive:No pathologic findings. Other: No ascites or pneumoperitoneum. Soft tissue gas in the visualized left deep groin fat with tracking along the inguinal canal and into the mons pubis/labia, incompletely covered. No organized collection. Musculoskeletal: No acute abnormalities. These results were called by telephone at the time of interpretation on 10/26/2019 at 5:48 am to provider [facility] , who verbally acknowledged these results. IMPRESSION: 1. Partially covered soft tissue gas in the deep fat of the left groin and perineum compatible with necrotizing infection. No visualized abscess ; there is incomplete coverage. 2. Cholelithiasis without signs of cholecystitis. Electronically Signed   By: Monte Fantasia M.D.   On: 10/26/2019 05:49    ROS 10 point review of systems is negative except as listed above in HPI.   Physical Exam Blood pressure 95/66, pulse (!) 118, temperature 99.7 F (37.6 C), temperature source Oral, resp. rate (!) 29, height 4\' 9"  (1.448 m), weight 81.6 kg, SpO2 90 %. Physical Exam Gen: no  distress, mildly lethargic Neuro: non-focal exam HEENT: PERRL Neck: supple CV: RRR Pulm: unlabored breathing Abd: soft,  NT GU: edema, cellulitis, and tenderness of L perineum Extr: wwp, no edema    Assessment/Plan: 40F with necrotizing soft tissue infection of left groin  Imaging, labwork, and clinical exam are supportive of NSTI. Patient has already received vanc/zosyn. Would recommend continuation of broad spectrum abx and urgent operative exploration with aggressive incision and debridement. Risks and benefits were carefully explained to the patient in Spanish using an interpreter (interpreter number listed on consent form), including the possible need for additional operative debridement(s) and informed consent was obtained. Given the location, we also discussed the possible need for debridement of regions involving the GU/reproductive tract and possible urology/gynecology intra-operative consultation, although this is unlikely. The patient was counseled on the importance of compliance with her diabetes medications and verbalized understanding. To OR this AM with Dr. Corliss Skainssuei.   Diamantina MonksAyesha N. Alara Daniel, MD General and Trauma Surgery [facility]

## 2019-10-26 NOTE — ED Provider Notes (Signed)
[facility]   CSN: [id number] Arrival date & time: 10/25/19  2115     History   Chief Complaint Chief Complaint  Patient presents with   Hyperglycemia   Nausea   Emesis    HPI Bluford Maindalia Shough is a 43 y.o. female.     HPI  This is a 43 year old female with history of type 2 diabetes who presents with abdominal pain nausea and vomiting.  Patient reports ongoing epigastric and right upper quadrant pain.  It is worse with eating.  She has noted increased nausea and vomiting.  She has a history of diabetes but has not taken anything for her diabetes in several years.  She was notably hyperglycemic in triage.  She denies any fevers at home.  No shortness of breath, cough, upper respiratory symptoms.  Denies chest pain.  She rates her pain at "20 out of 10."  Last menstrual period was at the end of November.  She does not believe herself to be pregnant.  History obtained with interpreter.  Past Medical History:  Diagnosis Date   Diabetes mellitus without complication (HCC)     There are no active problems to display for this patient.   History reviewed. No pertinent surgical history.   OB History   No obstetric history on file.      Home Medications    Prior to Admission medications   Not on File    Family History No family history on file.  Social History Social History   Tobacco Use   Smoking status: Not on file  Substance Use Topics   Alcohol use: Not on file   Drug use: Not on file     Allergies   Patient has no known allergies.   Review of Systems Review of Systems  Constitutional: Negative for fever.  Respiratory: Negative for shortness of breath.   Cardiovascular: Negative for chest pain.  Gastrointestinal: Positive for abdominal pain, nausea and vomiting. Negative for blood in stool.  Genitourinary: Negative for dysuria.  Neurological: Negative for headaches.  All other systems  reviewed and are negative.    Physical Exam Updated Vital Signs BP 117/67    Pulse (!) 125    Temp 99.7 F (37.6 C) (Oral)    Resp (!) 31    Ht 1.448 m (4\' 9" )    Wt 81.6 kg    SpO2 91%    BMI 38.95 kg/m   Physical Exam Vitals signs and nursing note reviewed.  Constitutional:      Appearance: She is well-developed. She is ill-appearing and diaphoretic.  HENT:     Head: Normocephalic and atraumatic.     Mouth/Throat:     Mouth: Mucous membranes are dry.  Eyes:     Pupils: Pupils are equal, round, and reactive to light.  Neck:     Musculoskeletal: Neck supple.  Cardiovascular:     Rate and Rhythm: Regular rhythm. Tachycardia present.     Heart sounds: Normal heart sounds.  Pulmonary:     Effort: Pulmonary effort is normal. No respiratory distress.     Breath sounds: No wheezing.  Abdominal:     General: Bowel sounds are normal.     Palpations: Abdomen is soft.     Tenderness: There is abdominal tenderness.     Comments: Epigastric and right upper quadrant tenderness to palpation, no rebound or guarding  Genitourinary:    Comments: After receiving radiology results, investigation above morning with fluctuance noted in  the left labia and subtle crepitus into the left inguinal and left thigh area, erythema overlying the region Musculoskeletal:     Right lower leg: No edema.     Left lower leg: No edema.  Skin:    General: Skin is warm.  Neurological:     Mental Status: She is alert and oriented to person, place, and time.  Psychiatric:        Mood and Affect: Mood normal.      ED Treatments / Results  Labs (all labs ordered are listed, but only abnormal results are displayed) Labs Reviewed  BASIC METABOLIC PANEL - Abnormal; Notable for the following components:      Result Value   Sodium 131 (*)    Chloride 96 (*)    Glucose, Bld 320 (*)    Calcium 8.7 (*)    All other components within normal limits  CBC - Abnormal; Notable for the following components:   WBC  22.0 (*)    Hemoglobin 9.5 (*)    HCT 31.4 (*)    MCV 75.7 (*)    MCH 22.9 (*)    RDW 17.4 (*)    All other components within normal limits  LACTIC ACID, PLASMA - Abnormal; Notable for the following components:   Lactic Acid, Venous 2.7 (*)    All other components within normal limits  HEMOGLOBIN A1C - Abnormal; Notable for the following components:   Hgb A1c MFr Bld 11.8 (*)    All other components within normal limits  CULTURE, BLOOD (ROUTINE X 2)  CULTURE, BLOOD (ROUTINE X 2)  RESPIRATORY PANEL BY RT PCR (FLU A&B, COVID)  URINALYSIS, ROUTINE W REFLEX MICROSCOPIC  LACTIC ACID, PLASMA  HEPATIC FUNCTION PANEL  LIPASE, BLOOD  I-STAT BETA HCG BLOOD, ED ([facility], [facility], [facility] ONLY)  CBG MONITORING, ED  CBG MONITORING, ED  CBG MONITORING, ED    EKG None  Radiology Ct Abdomen Pelvis W Contrast  Result Date: 10/26/2019 CLINICAL DATA:  Acute generalized abdominal pain EXAM: CT ABDOMEN AND PELVIS WITH CONTRAST TECHNIQUE: Multidetector CT imaging of the abdomen and pelvis was performed using the standard protocol following bolus administration of intravenous contrast. CONTRAST:  OMNIPAQUE IOHEXOL 300 MG/ML  SOLN COMPARISON:  None. FINDINGS: Lower chest:  No contributory findings. Hepatobiliary: No focal liver abnormality.Cholelithiasis and gallbladder sludge. No evidence of gallbladder inflammation. No bile duct dilatation. Pancreas: Unremarkable. Spleen: Unremarkable. Adrenals/Urinary Tract: Negative adrenals. No hydronephrosis or stone. Unremarkable bladder. Stomach/Bowel:  No obstruction. No appendicitis. Vascular/Lymphatic: No acute vascular abnormality. No mass or adenopathy. Reproductive:No pathologic findings. Other: No ascites or pneumoperitoneum. Soft tissue gas in the visualized left deep groin fat with tracking along the inguinal canal and into the mons pubis/labia, incompletely covered. No organized collection. Musculoskeletal: No acute abnormalities. These results were called by  telephone at the time of interpretation on 10/26/2019 at 5:48 am to provider [employer] [employer] [facility] , who verbally acknowledged these results. IMPRESSION: 1. Partially covered soft tissue gas in the deep fat of the left groin and perineum compatible with necrotizing infection. No visualized abscess ; there is incomplete coverage. 2. Cholelithiasis without signs of cholecystitis. Electronically Signed   By: Marnee Spring M.D.   On: 10/26/2019 05:49    Procedures Procedures (including critical care time)  CRITICAL CARE Performed by: Shon Baton   Total critical care time: 50 minutes  Critical care time was exclusive of separately billable procedures and treating other patients.  Critical care was necessary to treat or prevent imminent  or life-threatening deterioration.  Critical care was time spent personally by me on the following activities: development of treatment plan with patient and/or surrogate as well as nursing, discussions with consultants, evaluation of patient's response to treatment, examination of patient, obtaining history from patient or surrogate, ordering and performing treatments and interventions, ordering and review of laboratory studies, ordering and review of radiographic studies, pulse oximetry and re-evaluation of patient's condition.   Medications Ordered in ED Medications  piperacillin-tazobactam (ZOSYN) IVPB 3.375 g (3.375 g Intravenous New Bag/Given 10/26/19 0559)  vancomycin (VANCOCIN) IVPB 1000 mg/200 mL premix (1,000 mg Intravenous New Bag/Given 10/26/19 0602)  sodium chloride 0.9 % bolus 1,000 mL (1,000 mLs Intravenous New Bag/Given 10/26/19 0501)  morphine 4 MG/ML injection 4 mg (4 mg Intravenous Given 10/26/19 0503)  ondansetron (ZOFRAN) injection 4 mg (4 mg Intravenous Given 10/26/19 0503)  iohexol (OMNIPAQUE) 300 MG/ML solution 100 mL (100 mLs Intravenous Contrast Given 10/26/19 0530)     Initial Impression / Assessment and Plan / ED Course  I have  reviewed the triage vital signs and the nursing notes.  Pertinent labs & imaging results that were available during my care of the patient were reviewed by me and considered in my medical decision making (see chart for details).        Patient presents with abdominal pain, nausea, and vomiting.  Notably tachycardic on my evaluation and diaphoretic.  She is ill-appearing but nontoxic.  She is afebrile.  She has tenderness on exam without signs of peritonitis.  Patient was given fluids, morphine, and Zofran.  Lab work reviewed from triage.  White count of 22.  Glucose greater than 300 without anion gap.  I have added hepatic function and LFTs.  Considerations include gallbladder pathology, pancreatitis, gastritis, appendicitis.  For this reason we will obtain CT scan.  5:40 am Received phone call from radiology.  The patient does have gallstones but no evidence of cholecystitis.  More importantly, it appears that she has subcutaneous gas in the left perineum and groin concerning for necrotizing infection.  I have reassessed the patient.  Initially she denied any complaints in that region but then stated "I have a boil."  On reexamination she does have fluctuance with some subtle crepitus into the thigh and groin.  Patient was covered with Zosyn and vancomycin.  Blood cultures are pending.  General surgery was consulted.  6:13 AM Discussed with Dr. Bobbye Morton.  She will assess the patient.  Final Clinical Impressions(s) / ED Diagnoses   Final diagnoses:  Necrotizing subcutaneous infection [facility])    ED Discharge Orders    None       Merryl Hacker, MD 10/26/19 [phone]

## 2019-10-26 NOTE — Progress Notes (Signed)
Inpatient Diabetes Program Recommendations  AACE/ADA: New Consensus Statement on Inpatient Glycemic Control (2015)  Target Ranges:  Prepandial:   less than 140 mg/dL      Peak postprandial:   less than 180 mg/dL (1-2 hours)      Critically ill patients:  140 - 180 mg/dL   Lab Results  Component Value Date   GLUCAP 293 (H) 10/26/2019   HGBA1C 11.8 (H) 10/26/2019    Review of Glycemic Control Results for Morgan Williamson (MRN [id number]) as of 10/26/2019 10:50  Ref. Range 10/26/2019 08:18 10/26/2019 10:09  Glucose-Capillary Latest Ref Range: 70 - 99 mg/dL 339 (H) 293 (H)   Diabetes history: Type 2 DM Outpatient Diabetes medications: none Current orders for Inpatient glycemic control: none  Inpatient Diabetes Program Recommendations:    Noted consult.   Consider adding Levemir 12 units QD and Novolog 0-9 units Q4H OR if diet resumes TID & HS.  Will plan to speak with patient 12/9.  Thanks, Bronson Curb, MSN, RNC-OB Diabetes Coordinator [phone] (8a-5p)

## 2019-10-26 NOTE — Progress Notes (Signed)
MEWS Guidelines - (patients age 41 and over)  Red - At High Risk for Deterioration Yellow - At risk for Deterioration  1. Go to room and assess patient 2. Validate data. Is this patient's baseline? If data confirmed: 3. Is this an acute change? 4. Administer prn meds/treatments as ordered. 5. Note Sepsis score 6. Review goals of care 7. Sports coach, RRT nurse and Provider. 8. Ask Provider to come to bedside.  9. Document patient condition/interventions/response. 10. Increase frequency of vital signs and focused assessments to at least q15 minutes x 4, then q30 minutes x2. - If stable, then q1h x3, then q4h x3 and then q8h or dept. routine. - If unstable, contact Provider & RRT nurse. Prepare for possible transfer. 11. Add entry in progress notes using the smart phrase ".MEWS". 1. Go to room and assess patient 2. Validate data. Is this patient's baseline? If data confirmed: 3. Is this an acute change? 4. Administer prn meds/treatments as ordered? 5. Note Sepsis score 6. Review goals of care 7. Sports coach and Provider 8. Call RRT nurse as needed. 9. Document patient condition/interventions/response. 10. Increase frequency of vital signs and focused assessments to at least q2h x2. - If stable, then q4h x2 and then q8h or dept. routine. - If unstable, contact Provider & RRT nurse. Prepare for possible transfer. 11. Add entry in progress notes using the smart phrase ".MEWS".  Green - Likely stable Lavender - Comfort Care Only  1. Continue routine/ordered monitoring.  2. Review goals of care. 1. Continue routine/ordered monitoring. 2. Review goals of care.   Pt changed to RED MEWS for pulse, BP. Dr. Linda Hedges notified. He is at bedside and orders received. Dr. Georgette Dover notified also. Will follow guidelines to follow up. RR RN notified also.

## 2019-10-26 NOTE — Op Note (Signed)
Preop diagnosis: Necrotizing soft tissue infection of the left perineum Postop diagnosis: Same Procedure performed: Sharp incision and debridement with excision of abscess cavity of the left perineum involving skin and subcutaneous tissue (5 x 3 x 2 cm) Surgeon:Krosby Ritchie K Jaleel Allen Assistant:  Margie Billet PA-C Anesthesia: General Indications: This is a 43 year old female presented to the emergency department complaining mostly of right upper quadrant abdominal pain as well as nausea.  She is a noncompliant diabetic.  She does have gallstones but no sign of cholecystitis.  Also found to have what appears to be a necrotizing soft tissue infection in the left perineum.  We are asked to admit patient and address soft tissue infection.  Description of procedure: The operating room placed in supine position on the operating table.  After adequate level of general anesthesia was obtained, patient's legs were placed in lithotomy position in yellowfin stirrups.  Her perineum was prepped with Betadine and draped in sterile fashion.  There is an area of fluctuance and thickening in the left perineum.  I made 1 cm round incision in this area with cautery.  We encountered some purulent material.  This was cultured and sent for pathologic examination.  I explored the wound.  We enlarged slightly to a diameter of 3 cm.  I debrided some necrotic subcutaneous tissue.  We debrided back to healthy tissue.  This did not extend down to the fascia.  There is minimal undermining anteriorly.  Does not track to the groin.  We irrigated thoroughly and inspected for hemostasis.  Wound was packed with saline moistened gauze.  Dry dressings applied.  The patient was then extubated and brought to recovery in stable condition.  All sponge, instrument, and needle counts are correct.  Imogene Burn. Georgette Dover, MD, [facility] Surgery  General/ Trauma Surgery   10/26/2019 12:19 PM

## 2019-10-26 NOTE — Anesthesia Postprocedure Evaluation (Signed)
Anesthesia Post Note  Patient: Morgan Williamson  Procedure(s) Performed: INCISION AND DRAINAGE GROIN (Left Groin)     Patient location during evaluation: PACU Anesthesia Type: General Level of consciousness: awake and alert Pain management: pain level controlled Vital Signs Assessment: post-procedure vital signs reviewed and stable Respiratory status: spontaneous breathing, nonlabored ventilation, respiratory function stable and patient connected to nasal cannula oxygen Cardiovascular status: blood pressure returned to baseline, stable and tachycardic Postop Assessment: no apparent nausea or vomiting Anesthetic complications: no    Last Vitals:  Vitals:   10/26/19 1257 10/26/19 1312  BP: (!) 101/55 108/71  Pulse: (!) 116 (!) 117  Resp: (!) 22 20  Temp:  37.2 C  SpO2: 92% 94%    Last Pain:  Vitals:   10/26/19 1257  TempSrc:   PainSc: Carpio Brock

## 2019-10-27 ENCOUNTER — Encounter (HOSPITAL_COMMUNITY): Payer: Self-pay | Admitting: Surgery

## 2019-10-27 LAB — CBC
HCT: 27.1 % — ABNORMAL LOW (ref 36.0–46.0)
Hemoglobin: 8.2 g/dL — ABNORMAL LOW (ref 12.0–15.0)
MCH: 23 pg — ABNORMAL LOW (ref 26.0–34.0)
MCHC: 30.3 g/dL (ref 30.0–36.0)
MCV: 75.9 fL — ABNORMAL LOW (ref 80.0–100.0)
Platelets: 307 10*3/uL (ref 150–400)
RBC: 3.57 MIL/uL — ABNORMAL LOW (ref 3.87–5.11)
RDW: 17.5 % — ABNORMAL HIGH (ref 11.5–15.5)
WBC: 18.7 10*3/uL — ABNORMAL HIGH (ref 4.0–10.5)
nRBC: 0 % (ref 0.0–0.2)

## 2019-10-27 LAB — BASIC METABOLIC PANEL
Anion gap: 12 (ref 5–15)
BUN: 17 mg/dL (ref 6–20)
CO2: 21 mmol/L — ABNORMAL LOW (ref 22–32)
Calcium: 7.6 mg/dL — ABNORMAL LOW (ref 8.9–10.3)
Chloride: 104 mmol/L (ref 98–111)
Creatinine, Ser: 0.75 mg/dL (ref 0.44–1.00)
GFR calc Af Amer: >60 mL/min (ref >60)
GFR calc non Af Amer: >60 mL/min (ref >60)
Glucose, Bld: 253 mg/dL — ABNORMAL HIGH (ref 70–99)
Potassium: 3.7 mmol/L (ref 3.5–5.1)
Sodium: 137 mmol/L (ref 135–145)

## 2019-10-27 LAB — GLUCOSE, CAPILLARY
Glucose-Capillary: 244 mg/dL — ABNORMAL HIGH (ref 70–99)
Glucose-Capillary: 200 mg/dL — ABNORMAL HIGH (ref 70–99)
Glucose-Capillary: 146 mg/dL — ABNORMAL HIGH (ref 70–99)
Glucose-Capillary: 118 mg/dL — ABNORMAL HIGH (ref 70–99)

## 2019-10-27 LAB — FOLATE: Folate: 22.1 ng/mL (ref >5.9)

## 2019-10-27 LAB — RETICULOCYTES
Immature Retic Fract: 30.5 % — ABNORMAL HIGH (ref 2.3–15.9)
RBC.: 3.14 MIL/uL — ABNORMAL LOW (ref 3.87–5.11)
Retic Count, Absolute: 48 10*3/uL (ref 19.0–186.0)
Retic Ct Pct: 1.5 % (ref 0.4–3.1)

## 2019-10-27 LAB — IRON AND TIBC
Iron: 6 ug/dL — ABNORMAL LOW (ref 28–170)
Saturation Ratios: 2 % — ABNORMAL LOW (ref 10.4–31.8)
TIBC: 281 ug/dL (ref 250–450)
UIBC: 275 ug/dL

## 2019-10-27 LAB — FERRITIN: Ferritin: 45 ng/mL (ref 11–307)

## 2019-10-27 LAB — TSH: TSH: 2.399 u[IU]/mL (ref 0.350–4.500)

## 2019-10-27 LAB — VITAMIN B12: Vitamin B-12: 346 pg/mL (ref 180–914)

## 2019-10-27 MED ORDER — SODIUM CHLORIDE 0.9 % IV SOLN
510.0000 mg | INTRAVENOUS | Status: DC
  Administered 2019-10-27: 510 mg via INTRAVENOUS
  Filled 2019-10-27: qty 17

## 2019-10-27 MED ORDER — CLINDAMYCIN PHOSPHATE 600 MG/50ML IV SOLN: 600.0000 mg | Freq: Three times a day (TID) | INTRAVENOUS | Status: DC

## 2019-10-27 MED ORDER — VANCOMYCIN HCL 10 G IV SOLR
1250.0000 mg | INTRAVENOUS | Status: DC
  Administered 2019-10-27 – 2019-11-01 (×6): 1250 mg via INTRAVENOUS
  Filled 2019-10-27 (×6): qty 1250

## 2019-10-27 MED ORDER — INSULIN ASPART 100 UNIT/ML ~~LOC~~ SOLN
5.0000 [IU] | Freq: Three times a day (TID) | SUBCUTANEOUS | Status: DC
  Administered 2019-10-27 – 2019-11-02 (×14): 5 [IU] via SUBCUTANEOUS

## 2019-10-27 MED ORDER — INSULIN STARTER KIT- PEN NEEDLES (SPANISH)
1.0000 | Freq: Once | Status: AC
  Administered 2019-10-28: 1
  Filled 2019-10-27: qty 1

## 2019-10-27 MED ORDER — INSULIN DETEMIR 100 UNIT/ML ~~LOC~~ SOLN
24.0000 [IU] | Freq: Every day | SUBCUTANEOUS | Status: DC
  Administered 2019-10-27 – 2019-11-02 (×7): 24 [IU] via SUBCUTANEOUS
  Filled 2019-10-27 (×8): qty 0.24

## 2019-10-27 MED ORDER — ENOXAPARIN SODIUM 40 MG/0.4ML ~~LOC~~ SOLN
40.0000 mg | SUBCUTANEOUS | Status: DC
  Administered 2019-10-27 – 2019-11-02 (×7): 40 mg via SUBCUTANEOUS
  Filled 2019-10-27 (×7): qty 0.4

## 2019-10-27 NOTE — Progress Notes (Signed)
[facility] Progress Note  1 Day Post-Op  Subjective: CC-  Surgical area is sore. About to get into the shower. WBC trending down 18.7, TMAX 100.3. Glucose still elevated but seems to be slowly improving 244 << 286 <<322  States that she ate breakfast and tolerated it well. Denies n/v. Mild RUQ pain, but this is improved since admission. She does states that this has bothered her almost daily with PO intake for several months.  Objective: Vital signs in last 24 hours: Temp:  [97.4 F (36.3 C)-100.3 F (37.9 C)] 98.6 F (37 C) (12/09 0921) Pulse Rate:  [102-122] 105 (12/09 0921) Resp:  [18-30] 18 (12/09 0921) BP: (86-121)/(46-73) 108/68 (12/09 0921) SpO2:  [89 %-98 %] 93 % (12/09 0921)    Intake/Output from previous day: 12/08 0701 - 12/09 0700 In: 4241.9 [P.O.:50; I.V.:500; IV Piggyback:3641.9] Out: 5 [Blood:5] Intake/Output this shift: Total I/O In: 360 [P.O.:360] Out: -   PE: Gen:  Alert, NAD, pleasant HEENT: EOM's intact, pupils equal and round Pulm:  Rate and effort normal Abd: Soft, NT/ND, +BS, no HSM Skin: warm and dry GU: left perineal abscess s/p I&D with some persistent surrounding erythema and edema, no purulent drainage noted  Lab Results:  Recent Labs    10/25/19 2205 10/27/19 0148  WBC 22.0* 18.7*  HGB 9.5* 8.2*  HCT 31.4* 27.1*  PLT 348 307   BMET Recent Labs    10/25/19 2205 10/27/19 0148  NA 131* 137  K 3.8 3.7  CL 96* 104  CO2 22 21*  GLUCOSE 320* 253*  BUN 13 17  CREATININE 0.68 0.75  CALCIUM 8.7* 7.6*   PT/INR No results for input(s): LABPROT, INR in the last 72 hours. CMP     Component Value Date/Time   NA 137 10/27/2019 0148   K 3.7 10/27/2019 0148   CL 104 10/27/2019 0148   CO2 21 (L) 10/27/2019 0148   GLUCOSE 253 (H) 10/27/2019 0148   BUN 17 10/27/2019 0148   CREATININE 0.75 10/27/2019 0148   CALCIUM 7.6 (L) 10/27/2019 0148   PROT 6.9 10/26/2019 0455   ALBUMIN 3.0 (L) 10/26/2019 0455   AST 53 (H)  10/26/2019 0455   ALT 21 10/26/2019 0455   ALKPHOS 102 10/26/2019 0455   BILITOT 1.7 (H) 10/26/2019 0455   GFRNONAA >60 10/27/2019 0148   GFRAA >60 10/27/2019 0148   Lipase     Component Value Date/Time   LIPASE 17 10/26/2019 0455       Studies/Results: Ct Abdomen Pelvis W Contrast  Result Date: 10/26/2019 CLINICAL DATA:  Acute generalized abdominal pain EXAM: CT ABDOMEN AND PELVIS WITH CONTRAST TECHNIQUE: Multidetector CT imaging of the abdomen and pelvis was performed using the standard protocol following bolus administration of intravenous contrast. CONTRAST:  OMNIPAQUE IOHEXOL 300 MG/ML  SOLN COMPARISON:  None. FINDINGS: Lower chest:  No contributory findings. Hepatobiliary: No focal liver abnormality.Cholelithiasis and gallbladder sludge. No evidence of gallbladder inflammation. No bile duct dilatation. Pancreas: Unremarkable. Spleen: Unremarkable. Adrenals/Urinary Tract: Negative adrenals. No hydronephrosis or stone. Unremarkable bladder. Stomach/Bowel:  No obstruction. No appendicitis. Vascular/Lymphatic: No acute vascular abnormality. No mass or adenopathy. Reproductive:No pathologic findings. Other: No ascites or pneumoperitoneum. Soft tissue gas in the visualized left deep groin fat with tracking along the inguinal canal and into the mons pubis/labia, incompletely covered. No organized collection. Musculoskeletal: No acute abnormalities. These results were called by telephone at the time of interpretation on 10/26/2019 at 5:48 am to provider [facility] , who verbally acknowledged  these results. IMPRESSION: 1. Partially covered soft tissue gas in the deep fat of the left groin and perineum compatible with necrotizing infection. No visualized abscess ; there is incomplete coverage. 2. Cholelithiasis without signs of cholecystitis. Electronically Signed   By: Monte Fantasia M.D.   On: 10/26/2019 05:49    Anti-infectives: Anti-infectives (From admission, onward)   Start      Dose/Rate Route Frequency Ordered Stop   10/27/19 0800  vancomycin (VANCOCIN) 1,250 mg in sodium chloride 0.9 % 250 mL IVPB  Status:  Discontinued     1,250 mg 166.7 mL/hr over 90 Minutes Intravenous Every 24 hours 10/26/19 1410 10/26/19 1552   10/26/19 1430  piperacillin-tazobactam (ZOSYN) IVPB 3.375 g  Status:  Discontinued     3.375 g 12.5 mL/hr over 240 Minutes Intravenous Every 8 hours 10/26/19 1345 10/26/19 1350   10/26/19 1430  piperacillin-tazobactam (ZOSYN) IVPB 3.375 g     3.375 g 12.5 mL/hr over 240 Minutes Intravenous Every 8 hours 10/26/19 1350     10/26/19 1415  piperacillin-tazobactam (ZOSYN) IVPB 3.375 g  Status:  Discontinued     3.375 g 100 mL/hr over 30 Minutes Intravenous Every 8 hours 10/26/19 1407 10/26/19 1410   10/26/19 1400  piperacillin-tazobactam (ZOSYN) IVPB 3.375 g  Status:  Discontinued     3.375 g 100 mL/hr over 30 Minutes Intravenous Every 8 hours 10/26/19 1336 10/26/19 1344   10/26/19 0600  piperacillin-tazobactam (ZOSYN) IVPB 3.375 g     3.375 g 100 mL/hr over 30 Minutes Intravenous  Once 10/26/19 0546 10/26/19 0654   10/26/19 0600  vancomycin (VANCOCIN) IVPB 1000 mg/200 mL premix     1,000 mg 200 mL/hr over 60 Minutes Intravenous  Once 10/26/19 0554 10/26/19 0702       Assessment/Plan Type 2 diabetes -untreated, A1c 11.8. Appreciate [facility] recommendations  Hx cholelithiasis, Biliary colic - No indication for urgent surgical intervention. Will see how she does with PO intake. If she has persistent pain/nausea with diet she may benefit from cholecystectomy. If she tolerates diet we may be able to follow up with her as outpatient to discuss elective cholecystectomy.  Sepsis Necrotizing soft tissue infection left groin S/p Sharp incision and debridement with excision of abscess cavity of the left perineum involving skin and subcutaneous tissue (5 x 3 x 2 cm) 12/8 Dr. Georgette Dover - POD#1 - cultures pending - Start BID wet to dry dressing changes. Ok to  shower with wound open. WBC still elevated but trending down, will continue IV zosyn for today and plan to transition to augmentin at discharge for 1 week total of antibiotics postoperatively.  FEN: IV fluids, CM diet ID: Vancomycin 12/8, Zosyn 12/8 >> day 2 DVT:  SCD, lovenox Follow up:  Medicine consult/Diabetes coordinator/Dr. Georgette Dover   LOS: 0 days    Wellington Hampshire, [facility] Surgery 10/27/2019, 9:45 AM Please see Amion for pager number during day hours 7:00am-4:30pm

## 2019-10-27 NOTE — Plan of Care (Signed)

## 2019-10-27 NOTE — Progress Notes (Signed)
PROGRESS NOTE    Faythe Heitzenrater  NWG:[id number] DOB: 12-17-1975 DOA: 10/25/2019 PCP: System, Pcp Not In  Brief Narrative: 43 year old female with diabetes presented to the ED 12/8 with right upper quadrant abdominal pain and groin boil, infection for 1 week. -She was found to have cholelithiasis and necrotizing fasciitis -Taken to the OR by Dr. Corliss Skains 12/8, underwent incision and debridement with excision of abscess cavity in the left perineum   Assessment & Plan:   Necrotizing fasciitis of perineum -Status post incision and debridement by Dr. Corliss Skains 12/8 -Wound, Intra-Op Gram stain is polymicrobial, cultures pending -Blood cultures negative x1 day -Continue IV Zosyn, add vancomycin, given small purulent abscess also noted in the perineal area  Cholelithiasis -Needs cholecystectomy, inpatient versus elective -Patient lives in [place] and is visiting family in [place]  Type 2 diabetes mellitus -Hemoglobin A1c is 11.8, CBGs uncontrolled -Reports she was taking metformin prior to admission -Continue Lantus increased dose and add NovoLog premeal -Stressed the importance of good diabetes control especially in the setting of above infection -Weight loss recommended  Obesity -BMI is 38.9 -Needs to lose weight to control diabetes better  Microcytic anemia -Check anemia panel  DVT prophylaxis: Lovenox  code Status: Full code Family Communication: No family at bedside Disposition Plan: Home pending above management     Procedures:   Antimicrobials:    Subjective: -Complains of discomfort in her perineal region  Objective: Vitals:   10/26/19 2035 10/27/19 0032 10/27/19 0536 10/27/19 0921  BP: 108/73 121/73 119/69 108/68  Pulse: (!) 102 (!) 109 (!) 113 (!) 105  Resp: (!) 26 (!) Temp: 98.9 F (37.2 C) 99.2 F (37.3 C) 99.7 F (37.6 C) 98.6 F (37 C)  TempSrc: Oral Oral Oral Oral  SpO2: 97% 98% 91% 93%  Weight:      Height:        Intake/Output  Summary (Last 24 hours) at 10/27/2019 1251 Last data filed at 10/27/2019 0900 Gross per 24 hour  Intake 560 ml  Output --  Net 560 ml   Filed Weights   10/26/19 0504  Weight: 81.6 kg    Examination:  General exam: Obese pleasant female sitting up in bed AAOx3, no distress Respiratory system: Clear Cardiovascular system: S1 & S2 heard, RRR.  Gastrointestinal system: Abdomen is nondistended, soft and nontender.Normal bowel sounds heard. Central nervous system: Alert and oriented. No focal neurological deficits. Extremities: Edema Skin: Perineum with surgical dressing Psychiatry: Judgement and insight appear normal. Mood & affect appropriate.     Data Reviewed:   CBC: Recent Labs  Lab 10/25/19 2205 10/27/19 0148  WBC 22.0* 18.7*  HGB 9.5* 8.2*  HCT 31.4* 27.1*  MCV 75.7* 75.9*  PLT 348 307   Basic Metabolic Panel: Recent Labs  Lab 10/25/19 2205 10/27/19 0148  NA 131* 137  K 3.8 3.7  CL 96* 104  CO2 22 21*  GLUCOSE 320* 253*  BUN 13 17  CREATININE 0.68 0.75  CALCIUM 8.7* 7.6*   GFR: Estimated Creatinine Clearance: 81.5 mL/min (by C-G formula based on SCr of 0.75 mg/dL). Liver Function Tests: Recent Labs  Lab 10/26/19 0455  AST 53*  ALT 21  ALKPHOS 102  BILITOT 1.7*  PROT 6.9  ALBUMIN 3.0*   Recent Labs  Lab 10/26/19 0455  LIPASE 17   No results for input(s): AMMONIA in the last 168 hours. Coagulation Profile: No results for input(s): INR, PROTIME in the last 168 hours. Cardiac Enzymes: No results for input(s): CKTOTAL,  CKMB, CKMBINDEX, TROPONINI in the last 168 hours. BNP (last 3 results) No results for input(s): PROBNP in the last 8760 hours. HbA1C: Recent Labs    10/26/19 0454  HGBA1C 11.8*   CBG: Recent Labs  Lab 10/26/19 1233 10/26/19 1700 10/26/19 1937 10/27/19 0818 10/27/19 1154  GLUCAP 290* 322* 286* 244* 200*   Lipid Profile: No results for input(s): CHOL, HDL, LDLCALC, TRIG, CHOLHDL, LDLDIRECT in the last 72  hours. Thyroid Function Tests: Recent Labs    10/27/19 0148  TSH 2.399   Anemia Panel: Recent Labs    10/27/19 0813  VITAMINB12 346  FOLATE 22.1  FERRITIN 45  TIBC 281  IRON 6*  RETICCTPCT 1.5   Urine analysis:    Component Value Date/Time   COLORURINE YELLOW 10/26/2019 0908   APPEARANCEUR CLEAR 10/26/2019 0908   LABSPEC >1.046 (H) 10/26/2019 0908   PHURINE 5.0 10/26/2019 0908   GLUCOSEU >=500 (A) 10/26/2019 0908   HGBUR SMALL (A) 10/26/2019 0908   BILIRUBINUR NEGATIVE 10/26/2019 0908   KETONESUR 80 (A) 10/26/2019 0908   PROTEINUR 30 (A) 10/26/2019 0908   NITRITE NEGATIVE 10/26/2019 0908   LEUKOCYTESUR NEGATIVE 10/26/2019 0908   Sepsis Labs: @LABRCNTIP (procalcitonin:4,lacticidven:4)  ) Recent Results (from the past 240 hour(s))  Blood culture (routine x 2)     Status: None (Preliminary result)   Collection Time: 10/26/19  4:54 AM   Specimen: BLOOD  Result Value Ref Range Status   Specimen Description BLOOD LEFT ANTECUBITAL  Final   Special Requests   Final    BOTTLES DRAWN AEROBIC AND ANAEROBIC Blood Culture results may not be optimal due to an excessive volume of blood received in culture bottles   Culture   Final    NO GROWTH 1 DAY Performed at [facility], [address], [place], [place]    Report Status PENDING  Incomplete  Blood culture (routine x 2)     Status: None (Preliminary result)   Collection Time: 10/26/19  4:54 AM   Specimen: BLOOD  Result Value Ref Range Status   Specimen Description BLOOD RIGHT ANTECUBITAL  Final   Special Requests   Final    BOTTLES DRAWN AEROBIC AND ANAEROBIC Blood Culture results may not be optimal due to an excessive volume of blood received in culture bottles   Culture   Final    NO GROWTH 1 DAY Performed at [facility], [address], [place], [place]    Report Status PENDING  Incomplete  Respiratory Panel by RT PCR (Flu A&B, Covid) - Nasopharyngeal Swab     Status: None    Collection Time: 10/26/19  6:58 AM   Specimen: Nasopharyngeal Swab  Result Value Ref Range Status   SARS Coronavirus 2 by RT PCR NEGATIVE NEGATIVE Final    Comment: (NOTE) SARS-CoV-2 target nucleic acids are NOT DETECTED. The SARS-CoV-2 RNA is generally detectable in upper respiratoy specimens during the acute phase of infection. The lowest concentration of SARS-CoV-2 viral copies this assay can detect is 131 copies/mL. A negative result does not preclude SARS-Cov-2 infection and should not be used as the sole basis for treatment or other patient management decisions. A negative result may occur with  improper specimen collection/handling, submission of specimen other than nasopharyngeal swab, presence of viral mutation(s) within the areas targeted by this assay, and inadequate number of viral copies (<131 copies/mL). A negative result must be combined with clinical observations, patient history, and epidemiological information. The expected result is Negative.  Fact Sheet for Patients:  PinkCheek.be Fact Sheet for Healthcare Providers:  GravelBags.it This test is not yet ap proved or cleared by the [place] FDA and  has been authorized for detection and/or diagnosis of SARS-CoV-2 by FDA under an Emergency Use Authorization (EUA). This EUA will remain  in effect (meaning this test can be used) for the duration of the COVID-19 declaration under Section 564(b)(1) of the Act, 21 U.S.C. section 360bbb-3(b)(1), unless the authorization is terminated or revoked sooner.    Influenza A by PCR NEGATIVE NEGATIVE Final   Influenza B by PCR NEGATIVE NEGATIVE Final    Comment: (NOTE) The Xpert Xpress SARS-CoV-2/FLU/RSV assay is intended as an aid in  the diagnosis of influenza from Nasopharyngeal swab specimens and  should not be used as a sole basis for treatment. Nasal washings and  aspirates are unacceptable for Xpert Xpress  SARS-CoV-2/FLU/RSV  testing. Fact Sheet for Patients: PinkCheek.be Fact Sheet for Healthcare Providers: GravelBags.it This test is not yet approved or cleared by the [place] FDA and  has been authorized for detection and/or diagnosis of SARS-CoV-2 by  FDA under an Emergency Use Authorization (EUA). This EUA will remain  in effect (meaning this test can be used) for the duration of the  Covid-19 declaration under Section 564(b)(1) of the Act, 21  U.S.C. section 360bbb-3(b)(1), unless the authorization is  terminated or revoked. Performed at [facility], [place] [address], [place], [place] [address]   Aerobic/Anaerobic Culture (surgical/deep wound)     Status: None (Preliminary result)   Collection Time: 10/26/19 12:33 PM   Specimen: PATH Other; Tissue  Result Value Ref Range Status   Specimen Description GROIN LEFT  Final   Special Requests NONE  Final   Gram Stain   Final    ABUNDANT WBC PRESENT, PREDOMINANTLY PMN ABUNDANT GRAM NEGATIVE RODS ABUNDANT GRAM POSITIVE COCCI    Culture   Final    TOO YOUNG TO READ Performed at [facility], [place] [address], [place], [place] [address]    Report Status PENDING  Incomplete         Radiology Studies: Ct Abdomen Pelvis W Contrast  Result Date: 10/26/2019 CLINICAL DATA:  Acute generalized abdominal pain EXAM: CT ABDOMEN AND PELVIS WITH CONTRAST TECHNIQUE: Multidetector CT imaging of the abdomen and pelvis was performed using the standard protocol following bolus administration of intravenous contrast. CONTRAST:  161mL OMNIPAQUE IOHEXOL 300 MG/ML  SOLN COMPARISON:  None. FINDINGS: Lower chest:  No contributory findings. Hepatobiliary: No focal liver abnormality.Cholelithiasis and gallbladder sludge. No evidence of gallbladder inflammation. No bile duct dilatation. Pancreas: Unremarkable. Spleen: Unremarkable. Adrenals/Urinary Tract: Negative adrenals. No  hydronephrosis or stone. Unremarkable bladder. Stomach/Bowel:  No obstruction. No appendicitis. Vascular/Lymphatic: No acute vascular abnormality. No mass or adenopathy. Reproductive:No pathologic findings. Other: No ascites or pneumoperitoneum. Soft tissue gas in the visualized left deep groin fat with tracking along the inguinal canal and into the mons pubis/labia, incompletely covered. No organized collection. Musculoskeletal: No acute abnormalities. These results were called by telephone at the time of interpretation on 10/26/2019 at 5:48 am to provider [facility] , who verbally acknowledged these results. IMPRESSION: 1. Partially covered soft tissue gas in the deep fat of the left groin and perineum compatible with necrotizing infection. No visualized abscess ; there is incomplete coverage. 2. Cholelithiasis without signs of cholecystitis. Electronically Signed   By: Monte Fantasia M.D.   On: 10/26/2019 05:49        Scheduled Meds:  enoxaparin (LOVENOX) injection  40 mg Subcutaneous Q24H   insulin aspart  0-9 Units Subcutaneous TID WC   insulin aspart  5 Units Subcutaneous TID WC   insulin detemir  24 Units Subcutaneous Daily   scopolamine  1 patch Transdermal Once   Continuous Infusions:  ferumoxytol     lactated ringers 10 mL/hr at 10/26/19 1030   piperacillin-tazobactam (ZOSYN)  IV 3.375 g (10/27/19 0537)     LOS: 0 days    Time spent: 35min  Zannie CovePreetha Maragret Vanacker, MD [facility]  10/27/2019, 12:51 PM

## 2019-10-27 NOTE — Progress Notes (Addendum)
Inpatient Diabetes Program Recommendations  AACE/ADA: New Consensus Statement on Inpatient Glycemic Control (2015)  Target Ranges:  Prepandial:   less than 140 mg/dL      Peak postprandial:   less than 180 mg/dL (1-2 hours)      Critically ill patients:  140 - 180 mg/dL   Lab Results  Component Value Date   GLUCAP 200 (H) 10/27/2019   HGBA1C 11.8 (H) 10/26/2019    Review of Glycemic Control Results for LILLYMAE, DUET (MRN [id number]) as of 10/27/2019 16:11  Ref. Range 10/26/2019 17:00 10/26/2019 19:37 10/27/2019 08:18 10/27/2019 11:54  Glucose-Capillary Latest Ref Range: 70 - 99 mg/dL 322 (H) 286 (H) 244 (H) 200 (H)   Diabetes history: Type 2 DM Outpatient Diabetes medications: none Current orders for Inpatient glycemic control: novolog 0-9 units TID, Levemir 24 units QD Novolog 5 units tid  Inpatient Diabetes Program Recommendations:    Spoke with patient using interpreters regarding outpatient diabetes management. Patient admits to stopping visits with the clinic because she no longer qualified for a medication assistance program and wanted to self treat with diet and exercise.  Reviewed patient's current A1c of 11.8%. Explained what a A1c is and what it measures. Also reviewed goal A1c with patient, importance of good glucose control @ home, and blood sugar goals. Reviewed patho of DM, need for insulin, role of pancreas, impact of infection, excessive urination that could lead to repeated infections, hyper vs hypo symptoms and interventions, vascular changes and commorbidities, Patient needs a meter at discharge. Blood glucose meter (includes lancets and strips) (#[id number]). Encouraged to check 2 times per day and provided information on Rel[employer] products through [facility]. Patient states she could afford if necessary. CM consult placed to also assist with medications and follow up. Reviewed when to call MD, how/when to 70/30, and sick day rules. Educated patient on insulin pen use at home.  Reviewed contents of insulin flexpen starter kit. Reviewed all steps if insulin pen including attachment of needle, 2-unit air shot, dialing up dose, giving injection, removing needle, disposal of sharps, storage of unused insulin, disposal of insulin etc. Patient able to provide successful return demonstration. Also reviewed troubleshooting with insulin pen. MD to give patient Rxs for insulin pens and insulin pen needles. Novolin 70/30 may be best option in preparation for discharge.  Thanks, Bronson Curb, MSN, RNC-OB Diabetes Coordinator [phone] (8a-5p)

## 2019-10-27 NOTE — Progress Notes (Signed)
Dressing changed as ordered. Pt tolerated well. Will continue to monitor.  

## 2019-10-28 LAB — COMPREHENSIVE METABOLIC PANEL
ALT: 26 U/L (ref 0–44)
AST: 38 U/L (ref 15–41)
Albumin: 2.1 g/dL — ABNORMAL LOW (ref 3.5–5.0)
Alkaline Phosphatase: 89 U/L (ref 38–126)
Anion gap: 10 (ref 5–15)
BUN: 17 mg/dL (ref 6–20)
CO2: 21 mmol/L — ABNORMAL LOW (ref 22–32)
Calcium: 7.4 mg/dL — ABNORMAL LOW (ref 8.9–10.3)
Chloride: 102 mmol/L (ref 98–111)
Creatinine, Ser: 0.59 mg/dL (ref 0.44–1.00)
GFR calc Af Amer: >60 mL/min (ref >60)
GFR calc non Af Amer: >60 mL/min (ref >60)
Glucose, Bld: 147 mg/dL — ABNORMAL HIGH (ref 70–99)
Potassium: 3.4 mmol/L — ABNORMAL LOW (ref 3.5–5.1)
Sodium: 133 mmol/L — ABNORMAL LOW (ref 135–145)
Total Bilirubin: 0.4 mg/dL (ref 0.3–1.2)
Total Protein: 5.6 g/dL — ABNORMAL LOW (ref 6.5–8.1)

## 2019-10-28 LAB — GLUCOSE, CAPILLARY
Glucose-Capillary: 130 mg/dL — ABNORMAL HIGH (ref 70–99)
Glucose-Capillary: 131 mg/dL — ABNORMAL HIGH (ref 70–99)
Glucose-Capillary: 77 mg/dL (ref 70–99)
Glucose-Capillary: 145 mg/dL — ABNORMAL HIGH (ref 70–99)

## 2019-10-28 LAB — CBC
HCT: 24.6 % — ABNORMAL LOW (ref 36.0–46.0)
Hemoglobin: 7.4 g/dL — ABNORMAL LOW (ref 12.0–15.0)
MCH: 22.8 pg — ABNORMAL LOW (ref 26.0–34.0)
MCHC: 30.1 g/dL (ref 30.0–36.0)
MCV: 75.9 fL — ABNORMAL LOW (ref 80.0–100.0)
Platelets: 255 10*3/uL (ref 150–400)
RBC: 3.24 MIL/uL — ABNORMAL LOW (ref 3.87–5.11)
RDW: 17.7 % — ABNORMAL HIGH (ref 11.5–15.5)
WBC: 14.3 10*3/uL — ABNORMAL HIGH (ref 4.0–10.5)
nRBC: 0.3 % — ABNORMAL HIGH (ref 0.0–0.2)

## 2019-10-28 MED ORDER — FUROSEMIDE 10 MG/ML IJ SOLN
20.0000 mg | Freq: Once | INTRAMUSCULAR | Status: AC
  Administered 2019-10-28: 20 mg via INTRAVENOUS
  Filled 2019-10-28: qty 2

## 2019-10-28 MED ORDER — CELECOXIB 200 MG PO CAPS: 200.0000 mg | ORAL_CAPSULE | ORAL | Status: AC

## 2019-10-28 MED ORDER — ACETAMINOPHEN 325 MG PO TABS
650.0000 mg | ORAL_TABLET | Freq: Four times a day (QID) | ORAL | Status: DC
  Administered 2019-10-28 – 2019-11-02 (×16): 650 mg via ORAL
  Filled 2019-10-28 (×17): qty 2

## 2019-10-28 MED ORDER — POTASSIUM CHLORIDE CRYS ER 20 MEQ PO TBCR
40.0000 meq | EXTENDED_RELEASE_TABLET | Freq: Two times a day (BID) | ORAL | Status: AC
  Administered 2019-10-28 (×2): 40 meq via ORAL
  Filled 2019-10-28 (×2): qty 2

## 2019-10-28 MED ORDER — PANTOPRAZOLE SODIUM 40 MG PO TBEC
40.0000 mg | DELAYED_RELEASE_TABLET | Freq: Every day | ORAL | Status: DC
  Administered 2019-10-28 – 2019-11-02 (×5): 40 mg via ORAL
  Filled 2019-10-28 (×5): qty 1

## 2019-10-28 MED ORDER — CHLORHEXIDINE GLUCONATE CLOTH 2 % EX PADS: 6.0000 | MEDICATED_PAD | Freq: Once | CUTANEOUS | Status: DC

## 2019-10-28 MED ORDER — ADULT MULTIVITAMIN W/MINERALS CH
1.0000 | ORAL_TABLET | Freq: Every day | ORAL | Status: DC
  Administered 2019-10-28 – 2019-11-02 (×6): 1 via ORAL
  Filled 2019-10-28 (×6): qty 1

## 2019-10-28 MED ORDER — GABAPENTIN 300 MG PO CAPS: 300.0000 mg | ORAL_CAPSULE | ORAL | Status: AC

## 2019-10-28 MED ORDER — ACETAMINOPHEN 500 MG PO TABS: 1000.0000 mg | ORAL_TABLET | ORAL | Status: AC

## 2019-10-28 NOTE — Progress Notes (Signed)
Dressing changed as ordered. Pt tolerated well. Will continue to monitor.  

## 2019-10-28 NOTE — Discharge Instructions (Addendum)
Dressing Change.   Irrigate the open wound with water, then take a shower using soap and water.  Dry then repack open area with gauze.  This will slowly get smaller.  Do the dressing change twice a day until it has healed in and looks normal.        Indicaciones para aplicar una inyeccin de dosis nica de insulina, en adultos Insulin Injection Instructions, Single Insulin Dose, Adult Una inyeccin subcutnea es una inyeccin de medicamentos que se aplica en la capa de grasa y tejido que se encuentra entre la piel y Cavalierel msculo. Las personas con diabetes tipo1 deben inyectarse insulina porque su cuerpo no la producen. Las personas con diabetes tipo 2pueden necesitar inyecciones de insulina.  Hay diferentes tipos de insulina. El tipo de [place]ulina que use determinar cuntas inyecciones deber aplicarse y cundo. Materiales necesarios:  [place] y jabn para lavarse las manos.  Samule DryUna jeringa para insulina nueva y sin usar.  El frasco con insulina (ampolla).  Paos con alcohol.  Un recipiente para desechar elementos cortopunzantes (recipiente para objetos punzantes), por ejemplo, una botella de plstico vaca con tapa. Cmo elegir la zona para aplicar la inyeccin El cuerpo absorbe la insulina de manera diferente dependiendo de dnde se inyecte (sitio de la inyeccin). Lo mejor es inyectar la insulina siempre en la misma zona del cuerpo (por ejemplo, siempre en el abdomen), pero usando un lugar diferente de esa zona para cada inyeccin. No debe inyectarse la insulina siempre en el mismo lugar. Hay cinco zonas principales que pueden utilizarse para las inyecciones. Estas zonas son las siguientes:  El abdomen. Esta es la zona ms recomendada.  La cara anterior del muslo.  La cara superior y externa del muslo.  La cara superior y externa del brazo.  La cara superior y externa de la nalga. Cmo aplicar una inyeccin de dosis nica de Smithfield Foodsinsulina Primero, siga los pasos que se describen en  Preparacin y Engineer, miningluego contine con los pasos de Empuje aire dentro de la [place]olla. Despus, siga los pasos en [place] la jeringa y finalice con los pasos para Inyecte la [place]insulina. Preparacin 1. Lvese las manos con agua y Bel[place]jabn. Use desinfectante para manos si no dispone de [place] y Belarusjabn. 2. Antes de aplicarse la inyeccin de insulina, no se olvide de controlar su nivel de azcar en la sangre (glucemia) y anotar Administrator, sportsese valor. Siga las indicaciones de su mdico acerca de cmo proceder si su nivel de glucemia es mayor o menor que su rango normal. 3. Use una jeringa para insulina nueva y sin usar cada vez que deba aplicarse la inyeccin de [place] Springsinsulina. 4. Asegrese de tener el tipo correcto de Niuejeringa para la concentracin de insulina que [place]utiliza. 5. Verifique la fecha de vencimiento y el tipo de insulina que Coc[place] ([place]) Islandsutiliza. 6. Si va a utilizar PepsiCoinsulina CLARA, revise que sea clara y no tenga grumos. 7. No agite la ampolla para prepararla. Haga rodar Qwest Communicationssuavemente la ampolla entre sus palmas varias veces. 8. Quite la cubierta plstica de la ampolla de insulina. Las Berkshire Hathawayampollas tienen este tipo de Afghanistancubierta cuando son nuevas. 9. Limpie el tapn de goma de la ampolla con un pao con alcohol. 10. Retire la cubierta plstica de la aguja de la Gatesjeringa. No deje que la aguja toque nada. Empuje aire dentro de la ampolla 1. Para llenar la jeringa con aire, tire lentamente del mbolo de la [place]jeringa. Deje de tirar del mbolo cuando el indicador de dosis llegue al nmero de unidades que va a  usar. 2. Con el lado derecho de la ampolla mirando hacia arriba, introduzca la aguja a travs del tapn de goma de la ampolla. No coloque la ampolla boca abajo para hacer esto. 3. Empuje el mbolo hasta el fondo de la [place]. Al hacerlo, empujar aire hacia dentro de la [place]. 4. No retire la aguja de la ampolla todava. Llene la jeringa  1. Con la aguja an dentro de [employer], gire la ampolla hacia abajo y sostngala a nivel de los  ojos. 2. Tire lentamente del mbolo. Deje de tirar del mbolo cuando el indicador de dosis llegue al nmero de unidades que desea usar. 3. Si nota burbujas de aire en la [place], [place] lentamente el mbolo hacia arriba y [place] 2o 3veces para eliminarlas. ? Si tiene Ambulance person para eliminar las burbujas de [place], tire del mbolo hasta que el indicador de dosis vuelva a la dosis Geologist, engineering. 4. Quite la aguja de la ampolla. No deje que la aguja toque nada. Inyecte la insulina  1. Limpie el lugar donde aplicar la inyeccin con un pao con alcohol. Deje que el lugar se seque al aire. 2. Sostenga la jeringa como un lpiz, con la mano con la que escribe. 3. Use la otra mano para pellizcar y sujetar aproximadamente una pulgada (2,5cm) de piel. No toque directamente la zona limpia de la piel. 4. Inserte la aguja en la piel con cuidado pero rpidamente. La aguja debe estar en un ngulo de 90 grados (perpendicular) a la piel. 5. Empuje la aguja lo ms que se pueda (hasta el cono de la [place]). 6. Cuando la aguja est totalmente insertada en la piel, empuje el mbolo hasta el fondo de la jeringa con el pulgar o el dedo ndice de la mano con la que escribe para Tourist information centre manager la insulina. 7. Suelte la zona de la piel que est pellizcando. Contine sosteniendo la [place] en su lugar con la mano con la que escribe. 8. Espere 10segundos, y luego extraiga la aguja de la piel. [employer], permitir que pase toda la insulina de la jeringa y la aguja a su cuerpo. 9. Sostenga el pao con alcohol presionando sobre el sitio de la inyeccin Hotel manager cualquier sangrado. No frote la zona. 10. No vuelva a colocar la cubierta plstica en la aguja. 11. Deseche la Kyung Rudd y la aguja directamente en un recipiente para objetos punzantes, como una botella de plstico vaca con tapa. Cmo desechar los materiales usados  Deseche todas las agujas usadas en un recipiente para objetos cortopunzantes (resistente a  punciones). Pregunte en su farmacia local dnde puede conseguir este tipo de recipiente para desechos, o use una botella de plstico vaca con tapa, como las botellas de detergente lquido para la ropa.  Siga las normas de seguridad de la zona en la que usted vive para [facility] materiales usados. No use la jeringa ni la aguja ms de Lowe's Companies.  Deseche las ampollas vacas en el cesto de basura de su casa. Preguntas para hacerle al mdico  Con qu frecuencia debo inyectarme insulina?  Con qu frecuencia debo controlarme la glucemia?  Qu cantidad de insulina debo inyectarme cada vez?  Cules son los efectos secundarios?  Qu debo hacer si mi nivel de glucemia es muy alto?  Qu debo hacer si mi nivel de glucemia es muy bajo?  Qu debo hacer si olvido inyectarme la insulina?  A qu nmero debo llamar si tengo preguntas? Dnde encontrar ms informacin  Asociacin Estadounidense  de la Diabetes (American Diabetes Association, ADA): www.diabetes.org  Asociacin Estadounidense de Instructores para el Cuidado de la Diabetes (American Association of Diabetes Educators, AADE): https://www.diabeteseducator.org Resumen  Una inyeccin subcutnea es una inyeccin de medicamentos que se aplica en la capa de grasa y tejido que se encuentra entre la piel y [employer].  Antes de aplicarse la inyeccin de insulina, no se olvide de controlar su nivel de azcar en la sangre (glucemia) y anotar Administrator, sports.  El tipo de [place] que use determinar cuntas inyecciones deber aplicarse y cundo.  Verifique la fecha de vencimiento y el tipo de insulina que [place].  Lo mejor es inyectar la insulina siempre en la misma zona del cuerpo (por ejemplo, siempre en el abdomen), pero usando un lugar diferente de esa zona para cada inyeccin. Esta informacin no tiene Theme park manager el consejo del mdico. Asegrese de hacerle al mdico cualquier pregunta que tenga. Document Released: 02/10/2017  Document Revised: 08/05/2017 Document Reviewed: 12/08/2015 Elsevier Patient Education  2020 Elsevier Inc. Informacin bsica sobre la diabetes Diabetes Basics  La diabetes (diabetes mellitus) es una enfermedad de larga duracin (crnica). Se produce cuando el cuerpo no utiliza Artist (glucosa) que se libera de los alimentos despus de comer. La diabetes puede deberse a uno de Limited Brands o a ambos:  El pncreas no produce suficiente cantidad de una hormona llamada insulina.  El cuerpo no reacciona de forma normal a la insulina que produce. La insulina permite que ciertos azcares (glucosa) ingresen a las clulas del cuerpo. Esto le proporciona energa. Si tiene diabetes, los azcares no pueden ingresar a las clulas. Esto produce un aumento del nivel de Banker (hiperglucemia). Sigue estas instrucciones en tu casa: Cmo se trata la diabetes? Es posible que tenga que administrarse insulina u otros medicamentos para la diabetes todos los [place] para mantener el nivel de International aid/development worker en la sangre equilibrado. Adminstrese los medicamentos para la diabetes todos los [place] se lo haya indicado el mdico. Haga una lista de los medicamentos para la diabetes aqu: Medicamentos para la diabetes  Nombre del medicamento: ______________________________ ? Cantidad (dosis): ________________ Mammie Russian (a.m./p.m.): _______________ Cleon Dew: ___________________________________  Josephine Igo medicamento: ______________________________ ? Cantidad (dosis): ________________ Mammie Russian (a.m./p.m.): _______________ Cleon Dew: ___________________________________  Josephine Igo medicamento: ______________________________ ? Cantidad (dosis): ________________ Mammie Russian (a.m./p.m.): _______________ Cleon Dew: ___________________________________ Si [place] insulina, aprender cmo aplicrsela con inyecciones. Es posible que deba ajustar la cantidad en funcin de los alimentos que coma. Haga una lista de los tipos de  [place] aqu: Insulina  Tipo de insulina: ______________________________ ? Cantidad (dosis): ________________ Mammie Russian (a.m./p.m.): _______________ Cleon Dew: ___________________________________  Levander Campion: ______________________________ ? Cantidad (dosis): ________________ Mammie Russian (a.m./p.m.): _______________ Cleon Dew: ___________________________________  Levander Campion: ______________________________ ? Cantidad (dosis): ________________ Mammie Russian (a.m./p.m.): _______________ Cleon Dew: ___________________________________  Levander Campion: ______________________________ ? Cantidad (dosis): ________________ Mammie Russian (a.m./p.m.): _______________ Cleon Dew: ___________________________________  Levander Campion: ______________________________ ? Cantidad (dosis): ________________ Mammie Russian (a.m./p.m.): _______________ Cleon Dew: ___________________________________ Cmo me controlo el nivel de azcar en la sangre?  Controle sus niveles de azcar en la sangre con un medidor de glucemia segn las indicaciones del mdico. El mdico fijar los objetivos del tratamiento para usted. Generalmente, los resultados de los niveles de azcar en la sangre deben ser los siguientes:  Antes de las comidas (preprandial): de 80 a /dl (de 4,4 a 1,6XWRU/E).  Despus de las comidas (posprandial): por debajo de /dl (45WUJW/J).  Nivel de A1c: menos del 7%. Anote las veces que se controlar los niveles de International aid/development worker  en la sangre: Controles de azcar en la sangre  Hora: _______________ Cleon Dew: ___________________________________  Mammie Russian: _______________ Notas: ___________________________________  Hora: _______________ Notas: ___________________________________  Hora: _______________ Notas: ___________________________________  Hora: _______________ Notas: ___________________________________  Hora: _______________ Notas: ___________________________________  Eleonore Chiquito saber acerca del nivel bajo de azcar en  la sangre? Un nivel bajo de azcar en la sangre se denomina hipoglucemia. Este cuadro ocurre cuando el nivel de International aid/development worker en la sangre es igual o menor que 70mg /dl ( ). Entre los sntomas, se pueden incluir los siguientes:  Sentir: ? [place]. ? Preocupacin o nervios (ansiedad). ? Sudoracin y [place]. ? Confusin. ? Mareos. ? Somnolencia. ? Ganas de vomitar (nuseas).  Tener: ? Latidos cardacos acelerados. ? Dolor de [employer]. ? Cambios en la visin. ? Hormigueo y falta de sensibilidad (entumecimiento) alrededor de la boca, los labios o la [place]. ? Movimientos espasmdicos que no puede controlar (convulsiones).  Dificultades para hacer lo siguiente: ? Moverse (coordinacin). ? Dormir. ? Desmayos. ? Molestarse con facilidad (irritabilidad). Tratamiento del nivel bajo de azcar en la sangre Para tratar un nivel bajo de azcar en la sangre, ingiera un alimento o una bebida azucarada de inmediato. Si puede pensar con claridad y tragar de manera segura, siga la regla 15/15, que consiste en lo siguiente:  Consuma 15gramos de un hidrato de carbono de accin rpida (carbohidrato). Hable con su mdico acerca de cunto debera consumir.  Algunos hidratos de carbono de accin rpida son: ? Comprimidos de azcar (pastillas de glucosa). Consuma 3o 4pastillas de glucosa. ? De 6 a 8unidades de caramelos duros. ? De 4 a 6onzas (de 120 a [place]) de jugo de frutas. ? De 4 a 6onzas (de 120 a ) de refresco comn (no diettico). ? 1 cucharada (64ml) de miel o azcar.  Contrlese el nivel de azcar en la sangre 12m despus de ingerir el hidrato de carbono.  Si el nivel de azcar en la sangre todava es igual o menor que 70mg /dl ( ), ingiera nuevamente 15gramos de un hidrato de carbono.  Si el nivel de azcar en la sangre no supera los 70mg /dl ( ) despus de 3intentos, solicite ayuda de inmediato.  Ingiera una comida o una colacin en el  transcurso de 1hora despus de que el nivel de azcar en la sangre se haya normalizado. Tratamiento del nivel muy bajo de azcar en la sangre Si el nivel de azcar en la sangre es igual o menor que 54mg /dl (48mmol/l), significa que est muy bajo (hipoglucemia grave). Esto es . No espere a ver si los sntomas desaparecen. Solicite atencin mdica de inmediato. Comunquese con el servicio de emergencias de su localidad (911 en los [place]). No conduzca por sus propios medios 0,2IOXB/D hospital. Preguntas para hacerle al mdico  Es necesario que me rena con en el cuidado de la diabetes?  Qu equipos necesitar para cuidarme en casa?  Qu medicamentos para la diabetes necesito? Cundo debo tomarlos?  Con qu frecuencia debo controlar mi nivel de azcar en la sangre?  A qu nmero puedo llamar si tengo preguntas?  Cundo es mi prxima cita con el mdico?  Dnde puedo encontrar un grupo de apoyo para las personas con diabetes? Dnde buscar ms informacin  American Diabetes Association (Asociacin Estadounidense de la Diabetes): www.diabetes.org  American Association of Diabetes Educators (Asociacin Estadounidense de Instructores para el Cuidado de la Diabetes): www.diabeteseducator.org/patient-resources Comunquese con un mdico si:  El nivel de azcar en la sangre es igual o mayor que 240mg /dl (1m) durante 2das  seguidos.  Ha estado enfermo o ha tenido fiebre durante 2das o ms y no mejora.  Tiene alguno de estos problemas durante ms de 6horas: ? No puede comer ni beber. ? Siente malestar estomacal (nuseas). ? Vomita. ? Presenta heces lquidas (diarrea). Solicite ayuda inmediatamente si:  El nivel de azcar en la sangre est por debajo de /dl (60mmol/l).  Se siente confundida.  Tiene dificultad para hacer lo siguiente: ? Pensar con claridad. ? La respiracin. Resumen  La diabetes (diabetes mellitus) es una  enfermedad de larga duracin (crnica). Se produce cuando el cuerpo no utiliza Artist (glucosa) que se libera de los alimentos despus de la digestin.  Aplquese la insulina y tome los medicamentos para la diabetes como se lo hayan indicado.  Contrlese el nivel de azcar en la sangre todos los [place], con la frecuencia que le hayan indicado.  Concurra a todas las visitas de [address] se lo haya indicado el mdico. Esto es importante. Esta informacin no tiene Theme park manager el consejo del mdico. Asegrese de hacerle al mdico cualquier pregunta que tenga. Document Released: 03/13/2018 Document Revised: 12/30/2018 Document Reviewed: 03/13/2018 Elsevier Patient Education  2020 [employer].  Contar carbohidratos y la diabetes Por qu es importante el conteo de carbohidratos?  Contar las porciones de carbohidratos ayuda a Sales executive nivel de glucosa (azcar) en su sangre para que se sienta mejor.  El equilibrio [employer] carbohidratos que come y Dietitian determina el nivel de glucosa que tendr en la sangre despus de comer.  Contar carbohidratos tambin le ayudar a planificar sus comidas. Qu alimentos contienen carbohidratos? Entre los alimentos con carbohidratos se incluyen:  Panes, galletas saladas y cereales  Pastas, arroz y Runner, broadcasting/film/video (verduras) con almidn, como papas, elote (maz o Information systems manager) y Agricultural consultant (guisantes o arvejas)  Armed forces operational officer (habichuelas) y legumbres  [place], [place] de soya y Dentist  Frutas y jugos de fruta  Dulces como pasteles, Gaffer, helados, mermeladas y Nature conservation officer Porciones de carbohidratos Al planificar comidas para la diabetes, recuerde que un alimento con 1 porcin de carbohidratos contiene aproximadamente 15 gramos de carbohidratos:  Revise el tamao de las porciones con tazas y cucharas de medir o con una pesa de alimentos.  Lea los Datos de Nutricin en las etiquetas de los alimentos para saber cuntos  gramos de carbohidratos contienen los alimentos que come. Los [employer] de este folleto muestran porciones que contienen cerca de 15 gramos de carbohidratos. Copyright Academy of Nutrition and Dietetics. This handout may be duplicated for client education. Carbohydrate Counting for Diabetes (Spanish) - 2 Consejos para planificar sus comidas  Un Plan de Alimentacin indica cuntas porciones de carbohidratos consumir en sus comidas y refrigerios (snacks). Para muchos adultos es adecuado comer 3 a 5 porciones de carbohidratos en cada comida y de 1 a 2 porciones de carbohidratos, en cada refrigerio.  En un Plan de Alimentacin diaria saludable, la mayora de los carbohidratos provienen de: o Al menos 6 porciones de frutas y vegetales sin almidn o Al menos 6 porciones de Forensic scientist, frijoles y Sports administrator con almidn, con al menos 3 de estas porciones de granos integrales (enteros) o Al menos 2 porciones de Teaching laboratory technician o productos lcteos  Revise regularmente su nivel de glucosa en la sangre. Esto puede indicarle si necesita ajustar las horas a las que consume carbohidratos.  Comer alimentos que contienen [place], como granos [place], y comer muy pocos alimentos salados es bueno para su salud.  Coma 4 a 6  onzas de carne u otros alimentos con protenas (como hamburguesas de soya) cada da. Elija fuentes de protena bajas en grasa, como carne de res y de cerdo bajas en grasa, pollo, pescado, queso bajo en grasa o alimentos vegetarianos como la soya.  Coma algunas grasas saludables, como aceite de [place], de canola y nueces.  Coma muy pocas grasas saturadas. Estas grasas no son saludables y se Merchandiser, retail, la crema y las carnes con mucha grasa, como el tocino (tocineta) y las salchichas o Advertising copywriter.  Coma muy pocas o nada de grasas trans. Estas grasas no son saludables y se encuentran en todos los alimentos que contienen aceites parcialmente hidrogenados en su lista  de ingredientes. Consejos para leer etiquetas En los Datos de Nutricin de las etiquetas aparece una lista con el total de gramos de carbohidratos en una porcin estndar. La porcin estndar puede ser mayor o menor que 1 porcin de carbohidratos. Para saber cuntas porciones de carbohidratos hay en un alimento:  Primero mire el tamao de la porcin estndar de la [place].  Luego verifique el total de gramos de carbohidratos. Esta es la cantidad de carbohidratos en 1 porcin estndar. Divida el total de gramos de carbohidratos por 15. Este nmero equivale al nmero de porciones de carbohidratos en 1 porcin estndar. Recuerde: 1 porcin de carbohidratos equivale a 15 gramos de carbohidratos.  Nota: Puede ignorar los gramos de [employer] Datos de Nutricin, ya que estn incluidos en el total de gramos de carbohidratos. Copyright Academy of Nutrition and Dietetics. This handout may be duplicated for client education. Carbohydrate Counting for Diabetes (Spanish) - 3 Listas de alimentos para el conteo de carbohidratos 1 porcin = cerca de 15 gramos de carbohidratos Almidones  1 rebanada de pan (1 onza)  1 tortilla (6 pulgadas)   rosca de pan (bagel) grande (1 onza)  2 tortillas para taco (5 pulgadas)   pan para hamburguesa o para salchicha (hot dog) (3/4 onza)   taza de cereal listo para comer sin endulzar   taza de cereal cocido  1 taza de sopa a base de caldo  4-6 galletitas saladas  ? taza de pasta o arroz (cocidos)   taza de frijoles, chcharos, granos de elote, camotes (batatas, boniatos), calabaza (zapallo), pur de papas o papas hervidas (cocidos)   papa grande asada (3 onzas)   onza de pretzels, papitas o totopos (tortilla chips)  3 tazas de palomitas de maz (popcorn) (ya preparadas) Frutas  1 fruta fresca pequea ( a 1 taza)   taza de fruta enlatada o congelada  17 uvas pequeas (3 onzas)  1 taza de meln, bayas (moras)   vaso de  jugo de fruta  2 cucharadas de frutas secas (arndanos azules/blueberries, cerezas, arndanos rojos/cranberries, frutas surtidas, uvas pasas/pasitas) Leche  1 taza de [employer] o reducida en grasa  1 taza de leche de soya  ? taza de yogur descremado endulzado con un edulcorante sin azcar (6 onzas) Dulces y postres  pastel cuadrado de 2 pulgadas (sin betn/cobertura)  2 galletitas dulces (? onzas)   taza de helado o yogur congelado   taza de sorbete (sherbet) o nieve (sorbet)  1 cucharada de jarabe (sirope), mermelada, jalea, azcar o miel  2 cucharadas de jarabe bajo en caloras Copyright Academy of Nutrition and Dietetics. This handout may be duplicated for client education. Carbohydrate Counting for Diabetes (Spanish) - 4 Otros alimentos  Cuente 1 taza de vegetales crudos o  taza de vegetales sin almidn, cocidas,  como porciones de alimentos con cero (0) carbohidratos o sin restriccin. Si come 3 o ms porciones en una comida, cuntelas como 1 porcin de carbohidratos.  Los alimentos que contienen menos de 20 caloras en cada porcin tambin pueden contarse como porciones con cero carbohidratos o alimentos sin restriccin.  Cuente 1 taza de guiso (estofado) u otros alimentos combinados como 2 porciones de carbohidratos. Notas: Copyright Academy of Nutrition and Dietetics. This handout may be duplicated for client education. Carbohydrate Counting for Diabetes (Spanish) - 5 Contar carbohidratos y la diabetes: Ejemplo de men para 1 da Desayuno 1 pltano/banana pequeo (1 carbohidrato)  taza de hojuelas de maz (cornflakes) (1 carbohidrato) 1 taza de leche descremada o baja en grasa (1 carbohidrato) 1 rebanada de pan de trigo integral (1 carbohidrato) 1 cucharadita de margarina [place] 2 onzas de rebanadas de [place] 2 rebanadas de pan de trigo integral (2 carbohidratos) 2 hojas de Acupuncturist 4 palitos de apio 4 palitos de zanahoria 1 manzana  mediana (1 carbohidrato) 1 taza de leche descremada o baja en grasa (1 carbohidrato) Refrigerio 2 cucharadas de uvas pasas/pasitas (1 carbohidrato)  onzas de mini pretzels sin sal (1 carbohidrato) Cena 3 onzas de carne asada de res, magra  papa grande asada (2 carbohidratos) 1 cucharada de crema agria reducida en grasa  taza de ejotes/habichuelas verdes/chauchas 1 taza de ensalada de vegetales 1 cucharada de aderezo para ensaladas reducido en caloras 1 panecillo de trigo integral (1 carbohidrato) 1 cucharadita de margarina 1 taza de bolitas de meln (1 carbohidrato) Refrigerio 6 onzas de yogur de frutas bajo en grasa, sin azcar (1 carbohidrato) 2 cucharadas de nueces sin sal   Copyright Academy of Nutrition and Dietetics. This handout may be duplicated for client education.

## 2019-10-28 NOTE — Progress Notes (Signed)
PROGRESS NOTE    Morgan Williamson  ZOX:[id number] DOB: 1976/04/08 DOA: 10/25/2019 PCP: System, Pcp Not In  Brief Narrative: 43 year old female with diabetes presented to the ED 12/8 with right upper quadrant abdominal pain and groin boil, infection for 1 week. -She was found to have cholelithiasis and necrotizing fasciitis -Taken to the OR by Dr. Corliss Skainssuei 12/8, underwent incision and debridement with excision of abscess cavity in the left perineum   Assessment & Plan:   Sepsis, Necrotizing fasciitis of perineum -Status post incision and debridement by Dr. Corliss Skainssuei 12/8 -Wound, Intra-Op Gram stain is polymicrobial, cultures reincubated and pending -Blood cultures negative x 2 days -Continue day 3 of IV Zosyn, day 2 of vancomycin -Per CCS  Cholelithiasis -Plan for lap chole tomorrow -Patient lives in [place] and is visiting family in [place]  Type 2 diabetes mellitus -Hemoglobin A1c is 11.8, CBGs uncontrolled -Reports she was taking metformin prior to admission -Increase Lantus, NovoLog with meals  -Insulin teaching, will discharge on equivalent dose of NovoLog 70/30, I will write prescriptions for this, case management consult for medication assistance -Stressed the importance of good diabetes control especially in the setting of above infection -Weight loss recommended  Severe iron deficiency anemia -Could be from menorrhagia, worsened by Intra-Op blood loss and hemodilution -given IV iron 12/9 -Add oral iron at discharge -Follow-up with PCP for this -Transfuse if hb trends down further  Hypocalcemia -Corrected calcium for albumin of 2.1 is close to normal range around 9  Moderate protein calorie malnutrition -Albumin is 2.1, check prealbumin -RD consult  Obesity -BMI is 38.9 -Needs to lose weight to control diabetes better  Acute and chronic cough -Has trace evidence of fluid overload likely iatrogenic, low-dose Lasix x1 -Also longstanding cough for 4 years with some  symptoms of heartburn, suspect this is secondary to GERD, add PPI  DVT prophylaxis: Lovenox  code Status: Full code Family Communication: No family at bedside Disposition Plan: Home pending above management     Procedures:   Antimicrobials:    Subjective: -Complains of a cough, feels weak but overall getting better  Objective: Vitals:   10/27/19 0921 10/27/19 1444 10/27/19 2153 10/28/19 0428  BP: 108/68 109/68 101/65 112/72  Pulse: (!) 105 (!) 110 96 97  Resp: 18 18 (!) 25 20  Temp: 98.6 F (37 C) 98.4 F (36.9 C) 98.2 F (36.8 C) 99.3 F (37.4 C)  TempSrc: Oral Oral Oral Oral  SpO2: 93% 94% 91% 90%  Weight:      Height:        Intake/Output Summary (Last 24 hours) at 10/28/2019 1225 Last data filed at 10/27/2019 1800 Gross per 24 hour  Intake 501.43 ml  Output -  Net 501.43 ml   Filed Weights   10/26/19 0504  Weight: 81.6 kg    Examination:  Gen: Obese pleasant female sitting up in bed, AAO x3 HEENT: PERRLA, Neck supple, no JVD Lungs: Few basilar rales CVS: RRR,No Gallops,Rubs or new Murmurs Abd: soft, Non tender, non distended, BS present Extremities: No edema Skin: Perineum with surgical dressing Psychiatry: Judgement and insight appear normal. Mood & affect appropriate.     Data Reviewed:   CBC: Recent Labs  Lab 10/25/19 2205 10/27/19 0148 10/28/19 0134  WBC 22.0* 18.7* 14.3*  HGB 9.5* 8.2* 7.4*  HCT 31.4* 27.1* 24.6*  MCV 75.7* 75.9* 75.9*  PLT 348 307 255   Basic Metabolic Panel: Recent Labs  Lab 10/25/19 2205 10/27/19 0148 10/28/19 0134  NA 131* 137 133*  K 3.8 3.7 3.4*  CL 96* 104 102  CO2 22 21* 21*  GLUCOSE 320* 253* 147*  BUN 13 17 17   CREATININE 0.68 0.75 0.59  CALCIUM 8.7* 7.6* 7.4*   GFR: Estimated Creatinine Clearance: 81.5 mL/min (by C-G formula based on SCr of 0.59 mg/dL). Liver Function Tests: Recent Labs  Lab 10/26/19 0455 10/28/19 0134  AST 53* 38  ALT 21 26  ALKPHOS 102 89  BILITOT 1.7* 0.4  PROT  6.9 5.6*  ALBUMIN 3.0* 2.1*   Recent Labs  Lab 10/26/19 0455  LIPASE 17   No results for input(s): AMMONIA in the last 168 hours. Coagulation Profile: No results for input(s): INR, PROTIME in the last 168 hours. Cardiac Enzymes: No results for input(s): CKTOTAL, CKMB, CKMBINDEX, TROPONINI in the last 168 hours. BNP (last 3 results) No results for input(s): PROBNP in the last 8760 hours. HbA1C: Recent Labs    10/26/19 0454  HGBA1C 11.8*   CBG: Recent Labs  Lab 10/27/19 1154 10/27/19 1741 10/27/19 2132 10/28/19 0725 10/28/19 1156  GLUCAP 200* 146* 118* 130* 131*   Lipid Profile: No results for input(s): CHOL, HDL, LDLCALC, TRIG, CHOLHDL, LDLDIRECT in the last 72 hours. Thyroid Function Tests: Recent Labs    10/27/19 0148  TSH 2.399   Anemia Panel: Recent Labs    10/27/19 0813  VITAMINB12 346  FOLATE 22.1  FERRITIN 45  TIBC 281  IRON 6*  RETICCTPCT 1.5   Urine analysis:    Component Value Date/Time   COLORURINE YELLOW 10/26/2019 0908   APPEARANCEUR CLEAR 10/26/2019 0908   LABSPEC >1.046 (H) 10/26/2019 0908   PHURINE 5.0 10/26/2019 0908   GLUCOSEU >=500 (A) 10/26/2019 0908   HGBUR SMALL (A) 10/26/2019 0908   BILIRUBINUR NEGATIVE 10/26/2019 0908   KETONESUR 80 (A) 10/26/2019 0908   PROTEINUR 30 (A) 10/26/2019 0908   NITRITE NEGATIVE 10/26/2019 0908   LEUKOCYTESUR NEGATIVE 10/26/2019 0908   Sepsis Labs: @LABRCNTIP (procalcitonin:4,lacticidven:4)  ) Recent Results (from the past 240 hour(s))  Blood culture (routine x 2)     Status: None (Preliminary result)   Collection Time: 10/26/19  4:54 AM   Specimen: BLOOD  Result Value Ref Range Status   Specimen Description BLOOD LEFT ANTECUBITAL  Final   Special Requests   Final    BOTTLES DRAWN AEROBIC AND ANAEROBIC Blood Culture results may not be optimal due to an excessive volume of blood received in culture bottles   Culture   Final    NO GROWTH 2 DAYS Performed at [facility]  [address], [place], [place] [address]    Report Status PENDING  Incomplete  Blood culture (routine x 2)     Status: None (Preliminary result)   Collection Time: 10/26/19  4:54 AM   Specimen: BLOOD  Result Value Ref Range Status   Specimen Description BLOOD RIGHT ANTECUBITAL  Final   Special Requests   Final    BOTTLES DRAWN AEROBIC AND ANAEROBIC Blood Culture results may not be optimal due to an excessive volume of blood received in culture bottles   Culture   Final    NO GROWTH 2 DAYS Performed at [facility], [place] [address], [place], [place] [address]    Report Status PENDING  Incomplete  Respiratory Panel by RT PCR (Flu A&B, Covid) - Nasopharyngeal Swab     Status: None   Collection Time: 10/26/19  6:58 AM   Specimen: Nasopharyngeal Swab  Result Value Ref Range Status   SARS  Coronavirus 2 by RT PCR NEGATIVE NEGATIVE Final    Comment: (NOTE) SARS-CoV-2 target nucleic acids are NOT DETECTED. The SARS-CoV-2 RNA is generally detectable in upper respiratoy specimens during the acute phase of infection. The lowest concentration of SARS-CoV-2 viral copies this assay can detect is 131 copies/mL. A negative result does not preclude SARS-Cov-2 infection and should not be used as the sole basis for treatment or other patient management decisions. A negative result may occur with  improper specimen collection/handling, submission of specimen other than nasopharyngeal swab, presence of viral mutation(s) within the areas targeted by this assay, and inadequate number of viral copies (<131 copies/mL). A negative result must be combined with clinical observations, patient history, and epidemiological information. The expected result is Negative. Fact Sheet for Patients:  https://www.moore.com/ Fact Sheet for Healthcare Providers:  https://www.young.biz/ This test is not yet ap proved or cleared by the [place] FDA and  has been authorized for  detection and/or diagnosis of SARS-CoV-2 by FDA under an Emergency Use Authorization (EUA). This EUA will remain  in effect (meaning this test can be used) for the duration of the COVID-19 declaration under Section 564(b)(1) of the Act, 21 U.S.C. section 360bbb-3(b)(1), unless the authorization is terminated or revoked sooner.    Influenza A by PCR NEGATIVE NEGATIVE Final   Influenza B by PCR NEGATIVE NEGATIVE Final    Comment: (NOTE) The Xpert Xpress SARS-CoV-2/FLU/RSV assay is intended as an aid in  the diagnosis of influenza from Nasopharyngeal swab specimens and  should not be used as a sole basis for treatment. Nasal washings and  aspirates are unacceptable for Xpert Xpress SARS-CoV-2/FLU/RSV  testing. Fact Sheet for Patients: https://www.moore.com/ Fact Sheet for Healthcare Providers: https://www.young.biz/ This test is not yet approved or cleared by the [place] FDA and  has been authorized for detection and/or diagnosis of SARS-CoV-2 by  FDA under an Emergency Use Authorization (EUA). This EUA will remain  in effect (meaning this test can be used) for the duration of the  Covid-19 declaration under Section 564(b)(1) of the Act, 21  U.S.C. section 360bbb-3(b)(1), unless the authorization is  terminated or revoked. Performed at [facility], [address] [place], [place] [address]   Aerobic/Anaerobic Culture (surgical/deep wound)     Status: None (Preliminary result)   Collection Time: 10/26/19 12:33 PM   Specimen: PATH Other; Tissue  Result Value Ref Range Status   Specimen Description GROIN LEFT  Final   Special Requests NONE  Final   Gram Stain   Final    ABUNDANT WBC PRESENT, PREDOMINANTLY PMN ABUNDANT GRAM NEGATIVE RODS ABUNDANT GRAM POSITIVE COCCI    Culture   Final    CULTURE REINCUBATED FOR BETTER GROWTH Performed at [facility], [address], [place], [place] [address]    Report Status PENDING   Incomplete         Radiology Studies: No results found.      Scheduled Meds: . acetaminophen  1,000 mg Oral On Call to OR  . acetaminophen  650 mg Oral Q6H  . celecoxib  200 mg Oral On Call to OR  . Chlorhexidine Gluconate Cloth  6 each Topical Once   And  . Chlorhexidine Gluconate Cloth  6 each Topical Once  . enoxaparin (LOVENOX) injection  40 mg Subcutaneous Q24H  . gabapentin  300 mg Oral On Call to OR  . insulin aspart  0-9 Units Subcutaneous TID WC  . insulin aspart  5 Units Subcutaneous TID  WC  . insulin detemir  24 Units Subcutaneous Daily  . pantoprazole  40 mg Oral Q1200  . potassium chloride  40 mEq Oral BID  . scopolamine  1 patch Transdermal Once   Continuous Infusions: . ferumoxytol 510 mg (10/27/19 1759)  . lactated ringers 10 mL/hr at 10/26/19 1030  . piperacillin-tazobactam (ZOSYN)  IV 3.375 g (10/28/19 0547)  . vancomycin 1,250 mg (10/28/19 1216)     LOS: 1 day    Time spent:  Zannie Cove, MD [facility]  10/28/2019, 12:25 PM

## 2019-10-28 NOTE — TOC Initial Note (Signed)
Transition of Care [facility]) - Initial/Assessment Note    Patient Details  Name: Morgan Williamson MRN: [id number] Date of Birth: 08/09/1976  Transition [facility]) CM/SW Contact:    Kingsley Plan, RN Phone Number: 10/28/2019, 4:29 PM  Clinical Narrative:                 Spoke to patient at bedside with help of interpreter Wyvonnia Dusky . Confirmed face sheet information. Patient here with husband visiting her uncle. She plans to return to [place] in 15 days. Patient willing to be taught how to do wet to dry dressing changes and states her husband will assist her also.   Provided and explained MATCH letter. Medication assistance. Patient states she can afford $3 co pay per prescription. If patient discharges on a week day will use TOC pharmacy .  Patient currently on oxygen she does not use oxygen at home.    Will continue to follow.   Expected Discharge Plan: Home/Self Care Barriers to Discharge: Continued Medical Work up   Patient Goals and CMS Choice Patient states their goals for this hospitalization and ongoing recovery are:: to return to home CMS Medicare.gov Compare Post Acute Care list provided to:: Patient Choice offered to / list presented to : NA  Expected Discharge Plan and Services Expected Discharge Plan: Home/Self Care       Living arrangements for the past 2 months: Single Family Home                 DME Arranged: N/A         HH Arranged: NA          Prior Living Arrangements/Services Living arrangements for the past 2 months: Single Family Home Lives with:: Spouse Patient language and need for interpreter reviewed:: Yes Do you feel safe going back to the place where you live?: Yes      Need for Family Participation in Patient Care: Yes (Comment) Care giver support system in place?: Yes (comment)   Criminal Activity/Legal Involvement Pertinent to Current Situation/Hospitalization: No - Comment as needed  Activities of Daily Living Home  Assistive Devices/Equipment: None ADL Screening (condition at time of admission) Patient's cognitive ability adequate to safely complete daily activities?: Yes Is the patient deaf or have difficulty hearing?: No Does the patient have difficulty seeing, even when wearing glasses/contacts?: No Does the patient have difficulty concentrating, remembering, or making decisions?: No Patient able to express need for assistance with ADLs?: Yes Does the patient have difficulty dressing or bathing?: No Independently performs ADLs?: Yes (appropriate for developmental age) Does the patient have difficulty walking or climbing stairs?: No Weakness of Legs: Both Weakness of Arms/Hands: None  Permission Sought/Granted   Permission granted to share information with : No              Emotional Assessment Appearance:: Appears stated age Attitude/Demeanor/Rapport: Engaged Affect (typically observed): Accepting Orientation: : Oriented to Self, Oriented to Place, Oriented to  Time, Oriented to Situation Alcohol / Substance Use: Not Applicable Psych Involvement: No (comment)  Admission diagnosis:  Necrotizing subcutaneous infection (HCC) [M72.6] Patient Active Problem List   Diagnosis Date Noted  . Necrotizing fasciitis (HCC) 10/26/2019  . Type 2 diabetes mellitus without complication (HCC) 10/26/2019   PCP:  System, Pcp Not In Pharmacy:   [id number] [place], [place] - [address] AT [facility] & [address] [place] [address] Phone: [phone] Fax:  [place], [place] - [address] [place] [address] Phone: [phone] Fax: [phone]     Social Determinants of Health ([place]) Interventions    Readmission Risk Interventions No flowsheet data found.

## 2019-10-28 NOTE — Progress Notes (Signed)
Initial Nutrition Assessment  DOCUMENTATION CODES:   Obesity unspecified  INTERVENTION:   -MVI with minerals daily -RD will follow for diet advancement and adjust supplement regimen as appropriate -Provided "Carbohydrate Counting for Patients With Diabetes" handout (in Spanish- preferred language); attached to AVS/ discharge Summary  NUTRITION DIAGNOSIS:   Increased nutrient needs related to post-op healing as evidenced by estimated needs.  GOAL:   Patient will meet greater than or equal to 90% of their needs  MONITOR:   PO intake, Supplement acceptance, Diet advancement, Labs, Weight trends, Skin, I & O's  REASON FOR ASSESSMENT:   Consult Diet education  ASSESSMENT:   54F p/w RUQ abdominal pain x1 day and L groin "boil" x1 week. She reports associated n/v and constipation, but no fevers at home. She is actively vomiting at the time of my exam. She carries a diagnosis of diabetes, but reports not taking her medications which she describes as pills only, no insulin. History is obtained using a spanish interpreter.  Pt admitted with necrotizing soft tissue infection of lt groin.   12/8- s/p Procedure performed: Sharp incision and debridement with excision of abscess cavity of the left perineum involving skin and subcutaneous tissue (5 x 3 x 2 cm)  Pt sleeping soundly at time of visit. She has a good appetite, consuming 75-100% of meals on a clear liquid diet. Pt lives in [place], but is visiting family in [place].   Per MD notes, plan for lap chole tomorrow.   Lab Results  Component Value Date   HGBA1C 11.8 (H) 10/26/2019   PTA DM medications are none. Per DM coordinator note, pt stopped visit to her local clinic, as she no longer qualified for medication assistance program. Plan to d/c home on insulin.   Labs reviewed: Na: 133, K: 3.4, CBGS: 118-146 (inpatient orders for glycemic control are 5 units insulin aspart TID with meals, 0-9 units insulin aspart TID with  meals, and 24 units insulin detemir daily).   NUTRITION - FOCUSED PHYSICAL EXAM:    Most Recent Value  Orbital Region  No depletion  Upper Arm Region  No depletion  Thoracic and Lumbar Region  No depletion  Buccal Region  No depletion  Temple Region  No depletion  Clavicle Bone Region  No depletion  Clavicle and Acromion Bone Region  No depletion  Scapular Bone Region  No depletion  Dorsal Hand  No depletion  Patellar Region  No depletion  Anterior Thigh Region  No depletion  Posterior Calf Region  No depletion  Edema (RD Assessment)  None  Hair  Reviewed  Eyes  Reviewed  Mouth  Reviewed  Skin  Reviewed  Nails  Reviewed       Diet Order:   Diet Order            Diet NPO time specified  Diet effective midnight        Diet clear liquid Room service appropriate? Yes; Fluid consistency: Thin  Diet effective now              EDUCATION NEEDS:   No education needs have been identified at this time  Skin:  Skin Assessment: Skin Integrity Issues: Skin Integrity Issues:: Incisions Incisions: closed lt groin  Last BM:  Unknown  Height:   Ht Readings from Last 1 Encounters:  10/26/19 4\' 9"  (1.448 m)    Weight:   Wt Readings from Last 1 Encounters:  10/26/19 81.6 kg    Ideal Body Weight:  43.2 kg  BMI:  Body mass index is 38.95 kg/m.  Estimated Nutritional Needs:   Kcal:  1500-1700  Protein:  70-85 grams  Fluid:  > 1.5 L    Dyland Panuco A. Jimmye Norman, RD, LDN, [place] Registered Dietitian II Certified Diabetes Care and Education Specialist Pager: [phone] After hours Pager: [phone]

## 2019-10-28 NOTE — Progress Notes (Signed)
[facility] [place] [facility]/Trauma Progress Note  2 Days Post-Op   Assessment/Plan Type 2 diabetes-untreated, A1c 11.8. Appreciate [facility] assistance  Hx cholelithiasis, Biliary colic - pain, N and V with PO intact - pt would benefit from lap chole this admission, possibly tomorrow.  - CLD 2/2 N & V Sepsis - Necrotizing soft tissue infection left groin S/p Sharp incision and debridement with excision of abscess cavity of the left perineum involving skin and subcutaneous tissue (5 x 3 x 2 cm) 12/8 Dr. Georgette Dover - POD#2 - cultures pending - Start BID wet to dry dressing changes. Ok to shower with wound open. WBC still elevated but trending down, will continue IV zosyn for today and plan to transition to augmentin at discharge for 1 week total of antibiotics postoperatively. Anemia of unknown etiology - Hgb down to 7.4 today from 9.5 on admission - monitor, am labs Hypocalcemia - down to 7.4 today, monitor  FEN: CLD, PO potassium ID: Vancomycin 12/8>>, Zosyn 12/8>> DVT: SCD, lovenox Follow up: Medicine consult/Diabetes coordinator/Dr. Tsuei   LOS: 1 day    Subjective: CC: dizziness, abdominal pain, nausea and vomiting  Wound pain. Has not had any pain medicine yet today. She states last night after dinner she had abdominal pain, N and V. Vomiting this am also. RUQ pain is the same as before admission. Discussed lap chole. Pt just wants to feel better. She has had a non productive cough for 4 years. She denies acid reflux. [employer] interpreter used, Roselyn Reef.  Objective: Vital signs in last 24 hours: Temp:  [98.2 F (36.8 C)-99.3 F (37.4 C)] 99.3 F (37.4 C) (12/10 0428) Pulse Rate:  [96-110] 97 (12/10 0428) Resp:  [18-25] 20 (12/10 0428) BP: (101-112)/(65-72) 112/72 (12/10 0428) SpO2:  [90 %-94 %] 90 % (12/10 0428)    Intake/Output from previous day: 12/09 0701 - 12/10 0700 In: 861.4 [P.O.:560; IV Piggyback:301.4] Out: -  Intake/Output this shift: No intake/output data  recorded.  PE:  Gen:  Alert, NAD, pleasant, cooperative Card:  RRR, no M/G/R heard Pulm:  CTA, no W/R/R, rate and effort normal Abd: Soft, obese, +BS, TTP RUQ and epigastric region with guarding, no peritonitis.  GU: L perineal wound is clean with surrounding erythema and induration. L labia and L inner thigh induration. No purulent drainage noted. Dressing was saturated with urine.  Skin: no rashes noted, warm and dry   Anti-infectives: Anti-infectives (From admission, onward)   Start     Dose/Rate Route Frequency Ordered Stop   10/27/19 1400  clindamycin (CLEOCIN) IVPB 600 mg  Status:  Discontinued     600 mg 100 mL/hr over 30 Minutes Intravenous Every 8 hours 10/27/19 1250 10/27/19 1251   10/27/19 1300  vancomycin (VANCOCIN) 1,250 mg in sodium chloride 0.9 % 250 mL IVPB     1,250 mg 166.7 mL/hr over 90 Minutes Intravenous Every 24 hours 10/27/19 1251     10/27/19 0800  vancomycin (VANCOCIN) 1,250 mg in sodium chloride 0.9 % 250 mL IVPB  Status:  Discontinued     1,250 mg 166.7 mL/hr over 90 Minutes Intravenous Every 24 hours 10/26/19 1410 10/26/19 1552   10/26/19 1430  piperacillin-tazobactam (ZOSYN) IVPB 3.375 g  Status:  Discontinued     3.375 g 12.5 mL/hr over 240 Minutes Intravenous Every 8 hours 10/26/19 1345 10/26/19 1350   10/26/19 1430  piperacillin-tazobactam (ZOSYN) IVPB 3.375 g     3.375 g 12.5 mL/hr over 240 Minutes Intravenous Every 8 hours 10/26/19 1350     10/26/19  1415  piperacillin-tazobactam (ZOSYN) IVPB 3.375 g  Status:  Discontinued     3.375 g 100 mL/hr over 30 Minutes Intravenous Every 8 hours 10/26/19 1407 10/26/19 1410   10/26/19 1400  piperacillin-tazobactam (ZOSYN) IVPB 3.375 g  Status:  Discontinued     3.375 g 100 mL/hr over 30 Minutes Intravenous Every 8 hours 10/26/19 1336 10/26/19 1344   10/26/19 0600  piperacillin-tazobactam (ZOSYN) IVPB 3.375 g     3.375 g 100 mL/hr over 30 Minutes Intravenous  Once 10/26/19 0546 10/26/19 0654   10/26/19 0600   vancomycin (VANCOCIN) IVPB 1000 mg/200 mL premix     1,000 mg 200 mL/hr over 60 Minutes Intravenous  Once 10/26/19 0554 10/26/19 0702      Lab Results:  Recent Labs    10/27/19 0148 10/28/19 0134  WBC 18.7* 14.3*  HGB 8.2* 7.4*  HCT 27.1* 24.6*  PLT 307 255   BMET Recent Labs    10/27/19 0148 10/28/19 0134  NA 137 133*  K 3.7 3.4*  CL 104 102  CO2 21* 21*  GLUCOSE 253* 147*  BUN 17 17  CREATININE 0.75 0.59  CALCIUM 7.6* 7.4*   PT/INR No results for input(s): LABPROT, INR in the last 72 hours. CMP     Component Value Date/Time   NA 133 (L) 10/28/2019 0134   K 3.4 (L) 10/28/2019 0134   CL 102 10/28/2019 0134   CO2 21 (L) 10/28/2019 0134   GLUCOSE 147 (H) 10/28/2019 0134   BUN 17 10/28/2019 0134   CREATININE 0.59 10/28/2019 0134   CALCIUM 7.4 (L) 10/28/2019 0134   PROT 5.6 (L) 10/28/2019 0134   ALBUMIN 2.1 (L) 10/28/2019 0134   AST 38 10/28/2019 0134   ALT 26 10/28/2019 0134   ALKPHOS 89 10/28/2019 0134   BILITOT 0.4 10/28/2019 0134   GFRNONAA >60 10/28/2019 0134   GFRAA >60 10/28/2019 0134   Lipase     Component Value Date/Time   LIPASE 17 10/26/2019 0455    Studies/Results: No results found.   Jerre Simon, PA-C [facility] Please see amion for pager for the following: Venita Lick, W, & Friday 7:00am - 4:30pm Thursdays 7:00am -11:30am

## 2019-10-28 NOTE — Plan of Care (Signed)

## 2019-10-29 ENCOUNTER — Encounter (HOSPITAL_COMMUNITY): Admission: EM | Disposition: A | Discharge status: Home / Self Care | Payer: Self-pay | Source: Home / Self Care

## 2019-10-29 ENCOUNTER — Inpatient Hospital Stay (HOSPITAL_COMMUNITY): Payer: Self-pay

## 2019-10-29 DIAGNOSIS — R05 Cough: Secondary | ICD-10-CM

## 2019-10-29 DIAGNOSIS — D500 Iron deficiency anemia secondary to blood loss (chronic): Secondary | ICD-10-CM

## 2019-10-29 DIAGNOSIS — J189 Pneumonia, unspecified organism: Secondary | ICD-10-CM

## 2019-10-29 DIAGNOSIS — K8020 Calculus of gallbladder without cholecystitis without obstruction: Secondary | ICD-10-CM

## 2019-10-29 DIAGNOSIS — D509 Iron deficiency anemia, unspecified: Secondary | ICD-10-CM | POA: Diagnosis present

## 2019-10-29 DIAGNOSIS — E1165 Type 2 diabetes mellitus with hyperglycemia: Secondary | ICD-10-CM | POA: Diagnosis present

## 2019-10-29 DIAGNOSIS — A419 Sepsis, unspecified organism: Secondary | ICD-10-CM | POA: Diagnosis present

## 2019-10-29 DIAGNOSIS — K8050 Calculus of bile duct without cholangitis or cholecystitis without obstruction: Secondary | ICD-10-CM

## 2019-10-29 LAB — URINALYSIS, ROUTINE W REFLEX MICROSCOPIC
Bacteria, UA: NONE SEEN
Bilirubin Urine: NEGATIVE
Glucose, UA: NEGATIVE mg/dL
Ketones, ur: 20 mg/dL — AB
Nitrite: NEGATIVE
Protein, ur: 30 mg/dL — AB
Specific Gravity, Urine: 1.027 (ref 1.005–1.030)
pH: 5 (ref 5.0–8.0)

## 2019-10-29 LAB — COMPREHENSIVE METABOLIC PANEL
ALT: 34 U/L (ref 0–44)
AST: 35 U/L (ref 15–41)
Albumin: 1.9 g/dL — ABNORMAL LOW (ref 3.5–5.0)
Alkaline Phosphatase: 110 U/L (ref 38–126)
Anion gap: 8 (ref 5–15)
BUN: 10 mg/dL (ref 6–20)
CO2: 25 mmol/L (ref 22–32)
Calcium: 7.6 mg/dL — ABNORMAL LOW (ref 8.9–10.3)
Chloride: 102 mmol/L (ref 98–111)
Creatinine, Ser: 0.49 mg/dL (ref 0.44–1.00)
GFR calc Af Amer: >60 mL/min (ref >60)
GFR calc non Af Amer: >60 mL/min (ref >60)
Glucose, Bld: 110 mg/dL — ABNORMAL HIGH (ref 70–99)
Potassium: 3.1 mmol/L — ABNORMAL LOW (ref 3.5–5.1)
Sodium: 135 mmol/L (ref 135–145)
Total Bilirubin: 0.6 mg/dL (ref 0.3–1.2)
Total Protein: 5.4 g/dL — ABNORMAL LOW (ref 6.5–8.1)

## 2019-10-29 LAB — CBC
HCT: 23.3 % — ABNORMAL LOW (ref 36.0–46.0)
Hemoglobin: 7.2 g/dL — ABNORMAL LOW (ref 12.0–15.0)
MCH: 22.9 pg — ABNORMAL LOW (ref 26.0–34.0)
MCHC: 30.9 g/dL (ref 30.0–36.0)
MCV: 74.2 fL — ABNORMAL LOW (ref 80.0–100.0)
Platelets: 331 10*3/uL (ref 150–400)
RBC: 3.14 MIL/uL — ABNORMAL LOW (ref 3.87–5.11)
RDW: 17.9 % — ABNORMAL HIGH (ref 11.5–15.5)
WBC: 14.4 10*3/uL — ABNORMAL HIGH (ref 4.0–10.5)
nRBC: 0.3 % — ABNORMAL HIGH (ref 0.0–0.2)

## 2019-10-29 LAB — MAGNESIUM: Magnesium: 2.1 mg/dL (ref 1.7–2.4)

## 2019-10-29 LAB — GLUCOSE, CAPILLARY
Glucose-Capillary: 109 mg/dL — ABNORMAL HIGH (ref 70–99)
Glucose-Capillary: 123 mg/dL — ABNORMAL HIGH (ref 70–99)
Glucose-Capillary: 88 mg/dL (ref 70–99)
Glucose-Capillary: 89 mg/dL (ref 70–99)

## 2019-10-29 SURGERY — LAPAROSCOPIC CHOLECYSTECTOMY WITH INTRAOPERATIVE CHOLANGIOGRAM
Anesthesia: General

## 2019-10-29 MED ORDER — POTASSIUM CHLORIDE CRYS ER 20 MEQ PO TBCR
20.0000 meq | EXTENDED_RELEASE_TABLET | Freq: Two times a day (BID) | ORAL | Status: DC
  Administered 2019-10-29 – 2019-11-02 (×7): 20 meq via ORAL
  Filled 2019-10-29 (×8): qty 1

## 2019-10-29 MED ORDER — ALBUTEROL SULFATE HFA 108 (90 BASE) MCG/ACT IN AERS: 1.0000 | INHALATION_SPRAY | Freq: Four times a day (QID) | RESPIRATORY_TRACT | Status: DC | PRN

## 2019-10-29 MED ORDER — ALBUTEROL SULFATE (2.5 MG/3ML) 0.083% IN NEBU: 2.5000 mg | INHALATION_SOLUTION | Freq: Four times a day (QID) | RESPIRATORY_TRACT | Status: DC | PRN

## 2019-10-29 NOTE — Progress Notes (Signed)
Labs show questionable UTI with 11-20 WBC per high-powered field.  Mag is 2.1 she has potassium supplement ordered. Chest x-ray shows a left lower lobe possible pneumonia.  She is on vancomycin and Zosyn.  Dr. Rosanne Gutting has seen her and agrees with current biotics, she is added albuterol and incentive spirometry was encouraged again.

## 2019-10-29 NOTE — Progress Notes (Addendum)
[facility]  PROGRESS NOTE  Morgan Williamson UJW:[id number] DOB: December 14, 1975 DOA: 10/25/2019 PCP: System, Pcp Not In Admit date - 10/25/2019   Admitting Physician Md Montez Morita, MD  Outpatient Primary MD for the patient is System, Pcp Not In  LOS - 2 Brief Narrative   Morgan Williamson is a 43 y.o. year old female with medical history significant for diabetes on no home medications who presented on 10/25/2019 with RUQ abdominal pain with associated nausea/vomiting constipation x1 day and left groin boil of 1 week and high blood sugar and was found to have a necrotizing soft tissue infection left perineum.  Patient was empirically started on vancomycin and Zosyn and admitted by general surgery for urgent operative incision and debridement by Dr Corliss Skains on 12/8  [facility] was consulted on 12/8 to assist in management of her diabetes.   Hospital course complicated by left lower pneumonia  Subjective  Mrs.  Jeangilles today reports acute worsening of her chronic cough. She has difficulty controlling her urine because of her coughing episodes. Had an emesis episode earlier this am but is ready to try a more solid diet. A & P   Left lower lobe pna. Confirmed on CXR this am in evaluation of persistent dry coughing episodes -currently on broad spectrum antibiotics --encouraged incentive spirometry, add albuterol PRN  Type 2 diabetes, improved blood glucose over last 24 hours.  A1c 11.8%.  Fasting blood sugar this a.m. 109 no documented hypoglycemic episodes. -Continue Lantus 24 units for now, closely monitor CBGs, continue scheduled NovoLog with meals as long as any greater than 50%.  Sliding scale as needed. -Stressed importance of weight loss, close follow-up as outpatient.  Microcytic anemia, secondary to iron deficiency anemia (confirmed on iron panel), likely combination of menorrhagia and blood loss from recent operation.  Hemoglobin on arrival 9.5.  Has slowly down trended from 8.2 to 7.2 this a.m. No  bleeding since per patient -Status post Feraheme x2 on 12/9 -Daily CBC, blood transfusion for goal hemoglobin greater than 7  Hypokalemia.  Magnesium within normal limits.  Not having any current vomiting. -agree with daily potassium until emesis completely resolves, follow daily BMP  Sepsis secondary to necrotizing fasciitis of left perineum.  Status post I&D on 12/8.  Remains afebrile, hemodynamically stable, sepsis physiology resolved.  White count is downtrending. -Continue empiric vancomycin and Zosyn per surgical team -Blood cultures unremarkable -Intraoperative cultures pending (currently showing combined GNR's, GPC's)  Cholelithiasis with biliary colic. Improvement in nausea, no abdominal pain -Primary advancing diet from clear liquids to carb modified, closely monitor -Possible lap chole this admission, per surgery  Hypocalcemia.  Measured calcium of 7.6, corrected calcium of 9.3 (albumin 1.9) -Continue to closely monitor  Stress Urine incontinence. Has episodes in setting of cough from pna mentioned above. Ua shows WBC 11-20, but no bacteria and patient denies dysuria. Additionally already on broad spectrum antibiotics -monitor   Family Communication  :  None at bedside  Code Status :  FULL  Disposition Plan  :  Continued admission with IV antibiotics, close monitoring of hgb, left perineum infection, pna.    Lab Results  Component Value Date   PLT 331 10/29/2019    Diet :  Diet Order            Diet Carb Modified Fluid consistency: Thin; Room service appropriate? Yes  Diet effective now               Inpatient Medications Scheduled Meds: . acetaminophen  650 mg Oral  Q6H  . Chlorhexidine Gluconate Cloth  6 each Topical Once   And  . Chlorhexidine Gluconate Cloth  6 each Topical Once  . enoxaparin (LOVENOX) injection  40 mg Subcutaneous Q24H  . insulin aspart  0-9 Units Subcutaneous TID WC  . insulin aspart  5 Units Subcutaneous TID WC  . insulin detemir   24 Units Subcutaneous Daily  . multivitamin with minerals  1 tablet Oral Daily  . pantoprazole  40 mg Oral Q1200  . potassium chloride  20 mEq Oral BID PC  . scopolamine  1 patch Transdermal Once   Continuous Infusions: . ferumoxytol 510 mg (10/27/19 1759)  . lactated ringers 10 mL/hr at 10/26/19 1030  . piperacillin-tazobactam (ZOSYN)  IV 3.375 g (10/29/19 0444)  . vancomycin 1,250 mg (10/28/19 1216)   PRN Meds:.diphenhydrAMINE **OR** diphenhydrAMINE, methocarbamol, morphine injection, ondansetron **OR** ondansetron (ZOFRAN) IV, oxyCODONE  Antibiotics  :   Anti-infectives (From admission, onward)   Start     Dose/Rate Route Frequency Ordered Stop   10/27/19 1400  clindamycin (CLEOCIN) IVPB 600 mg  Status:  Discontinued     600 mg 100 mL/hr over 30 Minutes Intravenous Every 8 hours 10/27/19 1250 10/27/19 1251   10/27/19 1300  vancomycin (VANCOCIN) 1,250 mg in sodium chloride 0.9 % 250 mL IVPB     1,250 mg 166.7 mL/hr over 90 Minutes Intravenous Every 24 hours 10/27/19 1251     10/27/19 0800  vancomycin (VANCOCIN) 1,250 mg in sodium chloride 0.9 % 250 mL IVPB  Status:  Discontinued     1,250 mg 166.7 mL/hr over 90 Minutes Intravenous Every 24 hours 10/26/19 1410 10/26/19 1552   10/26/19 1430  piperacillin-tazobactam (ZOSYN) IVPB 3.375 g  Status:  Discontinued     3.375 g 12.5 mL/hr over 240 Minutes Intravenous Every 8 hours 10/26/19 1345 10/26/19 1350   10/26/19 1430  piperacillin-tazobactam (ZOSYN) IVPB 3.375 g     3.375 g 12.5 mL/hr over 240 Minutes Intravenous Every 8 hours 10/26/19 1350     10/26/19 1415  piperacillin-tazobactam (ZOSYN) IVPB 3.375 g  Status:  Discontinued     3.375 g 100 mL/hr over 30 Minutes Intravenous Every 8 hours 10/26/19 1407 10/26/19 1410   10/26/19 1400  piperacillin-tazobactam (ZOSYN) IVPB 3.375 g  Status:  Discontinued     3.375 g 100 mL/hr over 30 Minutes Intravenous Every 8 hours 10/26/19 1336 10/26/19 1344   10/26/19 0600   piperacillin-tazobactam (ZOSYN) IVPB 3.375 g     3.375 g 100 mL/hr over 30 Minutes Intravenous  Once 10/26/19 0546 10/26/19 0654   10/26/19 0600  vancomycin (VANCOCIN) IVPB 1000 mg/200 mL premix     1,000 mg 200 mL/hr over 60 Minutes Intravenous  Once 10/26/19 0554 10/26/19 0702       Objective   Vitals:   10/28/19 0428 10/28/19 1325 10/28/19 2052 10/29/19 0431  BP: 112/72 94/67 103/75 119/83  Pulse: 97 86 93 (!) 102  Resp: Temp: 99.3 F (37.4 C) 98.6 F (37 C) 98.2 F (36.8 C) 98.3 F (36.8 C)  TempSrc: Oral Oral Oral Oral  SpO2: 90% 94% 91% 98%  Weight:      Height:        SpO2: 98 % O2 Flow Rate (L/min): 2 L/min  Wt Readings from Last 3 Encounters:  10/26/19 81.6 kg     Intake/Output Summary (Last 24 hours) at 10/29/2019 0934 Last data filed at 10/29/2019 0923 Gross per 24 hour  Intake 890 ml  Output --  Net 890 ml    Physical Exam:  Awake Alert, Oriented X 3, Normal affect No new F.N deficits,  [place].AT, Dry oral mucosa Symmetrical Chest wall movement, Good air movement bilaterally on room air Tachycardic,No Gallops,Rubs or new Murmurs,  +ve B.Sounds, Abd Soft, No tenderness, no guarding or rigidity. Increased warmth, nonblanching redness and tenderness of left thigh     I have personally reviewed the following:   Data Reviewed:  CBC Recent Labs  Lab 10/25/19 2205 10/27/19 0148 10/28/19 0134 10/29/19 0404  WBC 22.0* 18.7* 14.3* 14.4*  HGB 9.5* 8.2* 7.4* 7.2*  HCT 31.4* 27.1* 24.6* 23.3*  PLT 348 307 255 331  MCV 75.7* 75.9* 75.9* 74.2*  MCH 22.9* 23.0* 22.8* 22.9*  MCHC 30.3 30.3 30.1 30.9  RDW 17.4* 17.5* 17.7* 17.9*    Chemistries  Recent Labs  Lab 10/25/19 2205 10/26/19 0455 10/27/19 0148 10/28/19 0134 10/29/19 0404  NA 131*  --  137 133* 135  K 3.8  --  3.7 3.4* 3.1*  CL 96*  --  104 102 102  CO2 22  --  21* 21* 25  GLUCOSE 320*  --  253* 147* 110*  BUN 13  --  17 17 10   CREATININE 0.68  --  0.75 0.59 0.49   CALCIUM 8.7*  --  7.6* 7.4* 7.6*  MG  --   --   --   --  2.1  AST  --  53*  --  38 35  ALT  --  21  --  26 34  ALKPHOS  --  102  --  89 110  BILITOT  --  1.7*  --  0.4 0.6   ------------------------------------------------------------------------------------------------------------------ No results for input(s): CHOL, HDL, LDLCALC, TRIG, CHOLHDL, LDLDIRECT in the last 72 hours.  Lab Results  Component Value Date   HGBA1C 11.8 (H) 10/26/2019   ------------------------------------------------------------------------------------------------------------------ Recent Labs    10/27/19 0148  TSH 2.399   ------------------------------------------------------------------------------------------------------------------ Recent Labs    10/27/19 0813  VITAMINB12 346  FOLATE 22.1  FERRITIN 45  TIBC 281  IRON 6*  RETICCTPCT 1.5    Coagulation profile No results for input(s): INR, PROTIME in the last 168 hours.  No results for input(s): DDIMER in the last 72 hours.  Cardiac Enzymes No results for input(s): CKMB, TROPONINI, MYOGLOBIN in the last 168 hours.  Invalid input(s): CK ------------------------------------------------------------------------------------------------------------------ No results found for: BNP  Micro Results Recent Results (from the past 240 hour(s))  Blood culture (routine x 2)     Status: None (Preliminary result)   Collection Time: 10/26/19  4:54 AM   Specimen: BLOOD  Result Value Ref Range Status   Specimen Description BLOOD LEFT ANTECUBITAL  Final   Special Requests   Final    BOTTLES DRAWN AEROBIC AND ANAEROBIC Blood Culture results may not be optimal due to an excessive volume of blood received in culture bottles   Culture   Final    NO GROWTH 2 DAYS Performed at [facility], [address] [place], [place] [address]    Report Status PENDING  Incomplete  Blood culture (routine x 2)     Status: None (Preliminary result)   Collection  Time: 10/26/19  4:54 AM   Specimen: BLOOD  Result Value Ref Range Status   Specimen Description BLOOD RIGHT ANTECUBITAL  Final   Special Requests   Final    BOTTLES DRAWN AEROBIC AND ANAEROBIC Blood Culture results may not be optimal due to an  excessive volume of blood received in culture bottles   Culture   Final    NO GROWTH 2 DAYS Performed at [facility], [place] [address], [place], [place] [address]    Report Status PENDING  Incomplete  Respiratory Panel by RT PCR (Flu A&B, Covid) - Nasopharyngeal Swab     Status: None   Collection Time: 10/26/19  6:58 AM   Specimen: Nasopharyngeal Swab  Result Value Ref Range Status   SARS Coronavirus 2 by RT PCR NEGATIVE NEGATIVE Final    Comment: (NOTE) SARS-CoV-2 target nucleic acids are NOT DETECTED. The SARS-CoV-2 RNA is generally detectable in upper respiratoy specimens during the acute phase of infection. The lowest concentration of SARS-CoV-2 viral copies this assay can detect is 131 copies/mL. A negative result does not preclude SARS-Cov-2 infection and should not be used as the sole basis for treatment or other patient management decisions. A negative result may occur with  improper specimen collection/handling, submission of specimen other than nasopharyngeal swab, presence of viral mutation(s) within the areas targeted by this assay, and inadequate number of viral copies (<131 copies/mL). A negative result must be combined with clinical observations, patient history, and epidemiological information. The expected result is Negative. Fact Sheet for Patients:  PinkCheek.be Fact Sheet for Healthcare Providers:  GravelBags.it This test is not yet ap proved or cleared by the [place] FDA and  has been authorized for detection and/or diagnosis of SARS-CoV-2 by FDA under an Emergency Use Authorization (EUA). This EUA will remain  in effect (meaning this test can be used)  for the duration of the COVID-19 declaration under Section 564(b)(1) of the Act, 21 U.S.C. section 360bbb-3(b)(1), unless the authorization is terminated or revoked sooner.    Influenza A by PCR NEGATIVE NEGATIVE Final   Influenza B by PCR NEGATIVE NEGATIVE Final    Comment: (NOTE) The Xpert Xpress SARS-CoV-2/FLU/RSV assay is intended as an aid in  the diagnosis of influenza from Nasopharyngeal swab specimens and  should not be used as a sole basis for treatment. Nasal washings and  aspirates are unacceptable for Xpert Xpress SARS-CoV-2/FLU/RSV  testing. Fact Sheet for Patients: PinkCheek.be Fact Sheet for Healthcare Providers: GravelBags.it This test is not yet approved or cleared by the [place] FDA and  has been authorized for detection and/or diagnosis of SARS-CoV-2 by  FDA under an Emergency Use Authorization (EUA). This EUA will remain  in effect (meaning this test can be used) for the duration of the  Covid-19 declaration under Section 564(b)(1) of the Act, 21  U.S.C. section 360bbb-3(b)(1), unless the authorization is  terminated or revoked. Performed at [facility], [place] [address] [place], [place] [address]   Aerobic/Anaerobic Culture (surgical/deep wound)     Status: None (Preliminary result)   Collection Time: 10/26/19 12:33 PM   Specimen: PATH Other; Tissue  Result Value Ref Range Status   Specimen Description GROIN LEFT  Final   Special Requests NONE  Final   Gram Stain   Final    ABUNDANT WBC PRESENT, PREDOMINANTLY PMN ABUNDANT GRAM NEGATIVE RODS ABUNDANT GRAM POSITIVE COCCI    Culture   Final    CULTURE REINCUBATED FOR BETTER GROWTH HOLDING FOR POSSIBLE ANAEROBE Performed at [facility], [place] [address], [place], [place] [address]    Report Status PENDING  Incomplete    Radiology Reports CT ABDOMEN PELVIS W CONTRAST  Result Date: 10/26/2019 CLINICAL DATA:  Acute generalized  abdominal pain EXAM: CT ABDOMEN AND PELVIS  WITH CONTRAST TECHNIQUE: Multidetector CT imaging of the abdomen and pelvis was performed using the standard protocol following bolus administration of intravenous contrast. CONTRAST:  OMNIPAQUE IOHEXOL 300 MG/ML  SOLN COMPARISON:  None. FINDINGS: Lower chest:  No contributory findings. Hepatobiliary: No focal liver abnormality.Cholelithiasis and gallbladder sludge. No evidence of gallbladder inflammation. No bile duct dilatation. Pancreas: Unremarkable. Spleen: Unremarkable. Adrenals/Urinary Tract: Negative adrenals. No hydronephrosis or stone. Unremarkable bladder. Stomach/Bowel:  No obstruction. No appendicitis. Vascular/Lymphatic: No acute vascular abnormality. No mass or adenopathy. Reproductive:No pathologic findings. Other: No ascites or pneumoperitoneum. Soft tissue gas in the visualized left deep groin fat with tracking along the inguinal canal and into the mons pubis/labia, incompletely covered. No organized collection. Musculoskeletal: No acute abnormalities. These results were called by telephone at the time of interpretation on 10/26/2019 at 5:48 am to provider [facility] , who verbally acknowledged these results. IMPRESSION: 1. Partially covered soft tissue gas in the deep fat of the left groin and perineum compatible with necrotizing infection. No visualized abscess ; there is incomplete coverage. 2. Cholelithiasis without signs of cholecystitis. Electronically Signed   By: Marnee Spring M.D.   On: 10/26/2019 05:49     Time Spent in minutes  30     Laverna Peace M.D on 10/29/2019 at 9:34 AM  To page go to [email or web address] - password [facility]

## 2019-10-29 NOTE — Progress Notes (Signed)
Pharmacy Antibiotic Note  Morgan Williamson is a 43 y.o. female admitted on 10/25/2019 with necrotizing fascitis of left groin.  Continues on Zosyn (day 4) and Vancomycin (day 3).  S/p I + D 12/8 Scr stable Cultures pending  Plan: Continue Vancomycin 1250 mg iv Q 24 hours Continue Zosyn 3.375 grams iv Q 8 hours Continue to follow    Height: 4\' 9"  (144.8 cm) Weight: 180 lb (81.6 kg) IBW/kg (Calculated) : 38.6  Temp (24hrs), Avg:98.4 F (36.9 C), Min:98.2 F (36.8 C), Max:98.6 F (37 C)  Recent Labs  Lab 10/25/19 2205 10/26/19 0454 10/26/19 0911 10/27/19 0148 10/28/19 0134 10/29/19 0404  WBC 22.0*  --   --  18.7* 14.3* 14.4*  CREATININE 0.68  --   --  0.75 0.59 0.49  LATICACIDVEN  --  2.7* 2.0*  --   --   --     Estimated Creatinine Clearance: 81.5 mL/min (by C-G formula based on SCr of 0.49 mg/dL).    No Known Allergies  Thank you Anette Guarneri, PharmD 10/29/2019 9:56 AM

## 2019-10-29 NOTE — Progress Notes (Signed)
Inpatient Diabetes Program Recommendations  AACE/ADA: New Consensus Statement on Inpatient Glycemic Control (2015)  Target Ranges:  Prepandial:   less than 140 mg/dL      Peak postprandial:   less than 180 mg/dL (1-2 hours)      Critically ill patients:  140 - 180 mg/dL   Lab Results  Component Value Date   GLUCAP 123 (H) 10/29/2019   HGBA1C 11.8 (H) 10/26/2019    Review of Glycemic Control  Good glycemic control today. Has been educated on insulin pen administration    Inpatient Diabetes Program Recommendations:     For discharge:  Novolin Insulin Pen - 70/30 18 units bid Insulin Pen Needles (#[id number]) Blood glucose meter (includes lancets and strips) (#[id number])   Thank you. Lorenda Peck, RD, LDN, CDE Inpatient Diabetes Coordinator [phone]

## 2019-10-29 NOTE — Progress Notes (Signed)
3 Days Post-Op    CC: Groin pain/abdominal pain  Subjective: Patient has multiple complaints this AM.  She has a headache, she still coughing and says it is worse.  She says she has trouble controlling her bladder.  She does not want her gallbladder out because someone said it was bad for people to lose the gallbladder.  She still has some mild fat necrosis in the area of her I&D.  Pads are soaking wet from urine.  She still has some cellulitis of her left thigh.  She denies any nausea with eating.  Objective: Vital signs in last 24 hours: Temp:  [98.2 F (36.8 C)-98.6 F (37 C)] 98.3 F (36.8 C) (12/11 0431) Pulse Rate:  [86-102] 102 (12/11 0431) Resp:  [19-20] 19 (12/11 0431) BP: (94-119)/(67-83) 119/83 (12/11 0431) SpO2:  [91 %-98 %] 98 % (12/11 0431)     840 Po 50 IV recorded Urine x 2 Afebrile, VSS K+ 3.1 WBC 14.4 H/H 7.2/23.3  Intake/Output from previous day: 12/10 0701 - 12/11 0700 In: 890 [P.O.:840; IV Piggyback:50] Out: -  Intake/Output this shift: No intake/output data recorded.  General appearance: alert, cooperative and no distress Resp: clear to auscultation bilaterally GI: Abdomen soft nontender she denies any pain.  No nausea or vomiting. Skin: She still has some cellulitis of the left thigh.  There is some fat necrosis at the base of the I&D site.  Pad is wet from urine.  Lab Results:  Recent Labs    10/28/19 0134 10/29/19 0404  WBC 14.3* 14.4*  HGB 7.4* 7.2*  HCT 24.6* 23.3*  PLT 255 331    BMET Recent Labs    10/28/19 0134 10/29/19 0404  NA 133* 135  K 3.4* 3.1*  CL 102 102  CO2 21* 25  GLUCOSE 147* 110*  BUN 17 10  CREATININE 0.59 0.49  CALCIUM 7.4* 7.6*   PT/INR No results for input(s): LABPROT, INR in the last 72 hours.  Recent Labs  Lab 10/26/19 0455 10/28/19 0134 10/29/19 0404  AST 53* 38 35  ALT 21 26 34  ALKPHOS 102 89 110  BILITOT 1.7* 0.4 0.6  PROT 6.9 5.6* 5.4*  ALBUMIN 3.0* 2.1* 1.9*     Lipase      Component Value Date/Time   LIPASE 17 10/26/2019 0455     Medications: . acetaminophen  650 mg Oral Q6H  . Chlorhexidine Gluconate Cloth  6 each Topical Once   And  . Chlorhexidine Gluconate Cloth  6 each Topical Once  . enoxaparin (LOVENOX) injection  40 mg Subcutaneous Q24H  . insulin aspart  0-9 Units Subcutaneous TID WC  . insulin aspart  5 Units Subcutaneous TID WC  . insulin detemir  24 Units Subcutaneous Daily  . multivitamin with minerals  1 tablet Oral Daily  . pantoprazole  40 mg Oral Q1200  . scopolamine  1 patch Transdermal Once   . ferumoxytol 510 mg (10/27/19 1759)  . lactated ringers 10 mL/hr at 10/26/19 1030  . piperacillin-tazobactam (ZOSYN)  IV 3.375 g (10/29/19 0444)  . vancomycin 1,250 mg (10/28/19 1216)   Assessment/Plan Type 2 diabetes-untreated, A1c 11.8. Appreciate [facility] assistance  Hx cholelithiasis, Biliary colic - pain, N and V with PO intact - pt would benefit from lap chole this admission, possibly tomorrow.  - CLD 2/2 N & V Sepsis - Necrotizing soft tissue infection left groin S/pSharp incision and debridement with excision of abscess cavity of the left perineum involving skin and subcutaneous tissue (5 x  3 x 2 cm)12/8 Dr. Corliss Skains - POD#2 - cultures pending - Start BID wet to dry dressing changes. Ok to shower with wound open. WBC still elevated but trending down, will continue IV zosyn for today and plan to transition to augmentin at discharge for 1 week total of antibiotics postoperatively. Anemia of unknown etiology - Hgb down to 7.4 today from 9.5 on admission - monitor, am labs Hypocalcemia - down to 7.6 today, monitor Hypokalemia -check magnesium and replace K+.  FEN: CLD, PO potassium ID: Vancomycin12/8>>,Zosyn 12/8>> DVT: SCD, lovenox Follow up: Medicine consult/Diabetes coordinator/Dr. Corliss Skains   Plan: Again check a UA and urine culture.  We will send her down for a chest x-ray.  Can have the nursing staff get her in the  shower letter shower with this I&D area open and then repacked.  Going to advance her diet and see how she does with carb modified diet.  Her husband does get off till 6 PM.  I will asked the nursing staff to teach him how to do the dressing changes this evening.  Recheck labs in a.m.    LOS: 2 days    Morgan Williamson 10/29/2019 Please see Amion

## 2019-10-30 ENCOUNTER — Inpatient Hospital Stay (HOSPITAL_COMMUNITY): Payer: Self-pay

## 2019-10-30 ENCOUNTER — Encounter (HOSPITAL_COMMUNITY): Payer: Self-pay

## 2019-10-30 DIAGNOSIS — D509 Iron deficiency anemia, unspecified: Secondary | ICD-10-CM

## 2019-10-30 LAB — PREPARE RBC (CROSSMATCH)

## 2019-10-30 LAB — CBC
HCT: 21.9 % — ABNORMAL LOW (ref 36.0–46.0)
Hemoglobin: 6.9 g/dL — CL (ref 12.0–15.0)
MCH: 23.2 pg — ABNORMAL LOW (ref 26.0–34.0)
MCHC: 31.5 g/dL (ref 30.0–36.0)
MCV: 73.7 fL — ABNORMAL LOW (ref 80.0–100.0)
Platelets: 345 10*3/uL (ref 150–400)
RBC: 2.97 MIL/uL — ABNORMAL LOW (ref 3.87–5.11)
RDW: 18.1 % — ABNORMAL HIGH (ref 11.5–15.5)
WBC: 15.7 10*3/uL — ABNORMAL HIGH (ref 4.0–10.5)
nRBC: 2.4 % — ABNORMAL HIGH (ref 0.0–0.2)

## 2019-10-30 LAB — BASIC METABOLIC PANEL
Anion gap: 8 (ref 5–15)
BUN: 8 mg/dL (ref 6–20)
CO2: 26 mmol/L (ref 22–32)
Calcium: 8 mg/dL — ABNORMAL LOW (ref 8.9–10.3)
Chloride: 101 mmol/L (ref 98–111)
Creatinine, Ser: 0.63 mg/dL (ref 0.44–1.00)
GFR calc Af Amer: >60 mL/min (ref >60)
GFR calc non Af Amer: >60 mL/min (ref >60)
Glucose, Bld: 102 mg/dL — ABNORMAL HIGH (ref 70–99)
Potassium: 3.4 mmol/L — ABNORMAL LOW (ref 3.5–5.1)
Sodium: 135 mmol/L (ref 135–145)

## 2019-10-30 LAB — ABO/RH: ABO/RH(D): O POS

## 2019-10-30 LAB — URINE CULTURE: Culture: NO GROWTH

## 2019-10-30 LAB — GLUCOSE, CAPILLARY
Glucose-Capillary: 91 mg/dL (ref 70–99)
Glucose-Capillary: 135 mg/dL — ABNORMAL HIGH (ref 70–99)
Glucose-Capillary: 126 mg/dL — ABNORMAL HIGH (ref 70–99)
Glucose-Capillary: 88 mg/dL (ref 70–99)

## 2019-10-30 MED ORDER — DOCUSATE SODIUM 100 MG PO CAPS
100.0000 mg | ORAL_CAPSULE | Freq: Two times a day (BID) | ORAL | Status: DC
  Administered 2019-10-30 – 2019-10-31 (×4): 100 mg via ORAL
  Filled 2019-10-30 (×6): qty 1

## 2019-10-30 MED ORDER — POLYETHYLENE GLYCOL 3350 17 G PO PACK
17.0000 g | PACK | Freq: Every day | ORAL | Status: DC
  Administered 2019-10-30 – 2019-10-31 (×2): 17 g via ORAL
  Filled 2019-10-30 (×3): qty 1

## 2019-10-30 MED ORDER — BISACODYL 10 MG RE SUPP: 10.0000 mg | Freq: Every day | RECTAL | Status: DC | PRN

## 2019-10-30 MED ORDER — SODIUM CHLORIDE 0.9% IV SOLUTION
Freq: Once | INTRAVENOUS | Status: AC
  Administered 2019-10-30: 15:00:00 via INTRAVENOUS

## 2019-10-30 MED ORDER — IOHEXOL 300 MG/ML  SOLN
100.0000 mL | Freq: Once | INTRAMUSCULAR | Status: AC | PRN
  Administered 2019-10-30: 100 mL via INTRAVENOUS

## 2019-10-30 MED ORDER — POTASSIUM CHLORIDE CRYS ER 20 MEQ PO TBCR
40.0000 meq | EXTENDED_RELEASE_TABLET | Freq: Once | ORAL | Status: AC
  Administered 2019-10-30: 40 meq via ORAL
  Filled 2019-10-30: qty 2

## 2019-10-30 NOTE — Progress Notes (Signed)
CRITICAL VALUE ALERT  Critical Value:  Hgb 6.9  Date & Time Notied:  10/30/19 at 0900  Provider Notified: Notified MD  Orders Received/Actions taken: awaiting

## 2019-10-30 NOTE — Progress Notes (Signed)
Patient ID: Morgan Williamson, female   DOB: 1975-12-09, 43 y.o.   MRN: [id number] [facility] Progress Note:   4 Days Post-Op  Subjective: Mental status is clear but we have a language barrier.  Objective: Vital signs in last 24 hours: Temp:  [97.8 F (36.6 C)-98.9 F (37.2 C)] 97.8 F (36.6 C) (12/12 0601) Pulse Rate:  [88-92] 88 (12/12 0601) Resp:  [18-24] 18 (12/12 0601) BP: (101-114)/(67-74) 101/67 (12/12 0601) SpO2:  [91 %-96 %] 96 % (12/12 0601)  Intake/Output from previous day: 12/11 0701 - 12/12 0700 In: 240 [P.O.:240] Out: -  Intake/Output this shift: No intake/output data recorded.  Physical Exam: Work of breathing is not labored.  Perineum incised and drained.    Lab Results:  Results for orders placed or performed during the hospital encounter of 10/25/19 (from the past 48 hour(s))  Glucose, capillary     Status: Abnormal   Collection Time: 10/28/19 11:56 AM  Result Value Ref Range   Glucose-Capillary 131 (H) 70 - 99 mg/dL  Glucose, capillary     Status: None   Collection Time: 10/28/19  4:45 PM  Result Value Ref Range   Glucose-Capillary 77 70 - 99 mg/dL  Glucose, capillary     Status: Abnormal   Collection Time: 10/28/19  8:58 PM  Result Value Ref Range   Glucose-Capillary 145 (H) 70 - 99 mg/dL   Comment 1 Notify RN    Comment 2 Document in Chart   CBC     Status: Abnormal   Collection Time: 10/29/19  4:04 AM  Result Value Ref Range   WBC 14.4 (H) 4.0 - 10.5 K/uL   RBC 3.14 (L) 3.87 - 5.11 MIL/uL   Hemoglobin 7.2 (L) 12.0 - 15.0 g/dL    Comment: Reticulocyte Hemoglobin testing may be clinically indicated, consider ordering this additional test [id number]    HCT 23.3 (L) 36.0 - 46.0 %   MCV 74.2 (L) 80.0 - 100.0 fL   MCH 22.9 (L) 26.0 - 34.0 pg   MCHC 30.9 30.0 - 36.0 g/dL   RDW 53.6 (H) 14.4 - 31.5 %   Platelets 331 150 - 400 K/uL   nRBC 0.3 (H) 0.0 - 0.2 %    Comment: Performed at [facility], [address] [place], [place]  [address]  CMET Routine     Status: Abnormal   Collection Time: 10/29/19  4:04 AM  Result Value Ref Range   Sodium 135 135 - 145 mmol/L   Potassium 3.1 (L) 3.5 - 5.1 mmol/L   Chloride 102 98 - 111 mmol/L   CO2 25 22 - 32 mmol/L   Glucose, Bld 110 (H) 70 - 99 mg/dL   BUN 10 6 - 20 mg/dL   Creatinine, Ser 7.61 0.44 - 1.00 mg/dL   Calcium 7.6 (L) 8.9 - 10.3 mg/dL   Total Protein 5.4 (L) 6.5 - 8.1 g/dL   Albumin 1.9 (L) 3.5 - 5.0 g/dL   AST 35 15 - 41 U/L   ALT 34 0 - 44 U/L   Alkaline Phosphatase 110 38 - 126 U/L   Total Bilirubin 0.6 0.3 - 1.2 mg/dL   GFR calc non Af Amer >60 >60 mL/min   GFR calc Af Amer >60 >60 mL/min   Anion gap 8 5 - 15    Comment: Performed at [facility], [address] [place], [place] [address]  Magnesium     Status: None   Collection Time: 10/29/19  4:04 AM  Result Value Ref Range   Magnesium 2.1 1.7 - 2.4 mg/dL    Comment: Performed at [facility], [address], [place], [place] [address]  Glucose, capillary     Status: Abnormal   Collection Time: 10/29/19  7:59 AM  Result Value Ref Range   Glucose-Capillary 109 (H) 70 - 99 mg/dL  Urinalysis, Routine w reflex microscopic     Status: Abnormal   Collection Time: 10/29/19  8:17 AM  Result Value Ref Range   Color, Urine YELLOW YELLOW   APPearance CLEAR CLEAR   Specific Gravity, Urine 1.027 1.005 - 1.030   pH 5.0 5.0 - 8.0   Glucose, UA NEGATIVE NEGATIVE mg/dL   Hgb urine dipstick SMALL (A) NEGATIVE   Bilirubin Urine NEGATIVE NEGATIVE   Ketones, ur 20 (A) NEGATIVE mg/dL   Protein, ur 30 (A) NEGATIVE mg/dL   Nitrite NEGATIVE NEGATIVE   Leukocytes,Ua SMALL (A) NEGATIVE   RBC / HPF 6-10 0 - 5 RBC/hpf   WBC, UA 11-20 0 - 5 WBC/hpf   Bacteria, UA NONE SEEN NONE SEEN   Squamous Epithelial / LPF 0-5 0 - 5    Comment: Performed at [facility], [address], [place], [place] [address]  Culture, Urine     Status: None   Collection Time: 10/29/19  8:50 AM   Specimen: Urine, Clean  Catch  Result Value Ref Range   Specimen Description URINE, CLEAN CATCH    Special Requests NONE    Culture      NO GROWTH Performed at [facility], [address] [place], [place] [address]    Report Status 10/30/2019 FINAL   Glucose, capillary     Status: Abnormal   Collection Time: 10/29/19 12:25 PM  Result Value Ref Range   Glucose-Capillary 123 (H) 70 - 99 mg/dL  Glucose, capillary     Status: None   Collection Time: 10/29/19  5:01 PM  Result Value Ref Range   Glucose-Capillary 88 70 - 99 mg/dL  Glucose, capillary     Status: None   Collection Time: 10/29/19  9:46 PM  Result Value Ref Range   Glucose-Capillary 89 70 - 99 mg/dL  Glucose, capillary     Status: None   Collection Time: 10/30/19  7:47 AM  Result Value Ref Range   Glucose-Capillary 91 70 - 99 mg/dL  CBC     Status: Abnormal   Collection Time: 10/30/19  8:06 AM  Result Value Ref Range   WBC 15.7 (H) 4.0 - 10.5 K/uL   RBC 2.97 (L) 3.87 - 5.11 MIL/uL   Hemoglobin 6.9 (LL) 12.0 - 15.0 g/dL    Comment: REPEATED TO VERIFY Reticulocyte Hemoglobin testing may be clinically indicated, consider ordering this additional test [id number] THIS CRITICAL RESULT HAS VERIFIED AND BEEN CALLED TO D.ANGELITO,RN BY JESSICA WEBBER ON 12 12 2020 AT 0854, AND HAS BEEN READ BACK.     HCT 21.9 (L) 36.0 - 46.0 %   MCV 73.7 (L) 80.0 - 100.0 fL   MCH 23.2 (L) 26.0 - 34.0 pg   MCHC 31.5 30.0 - 36.0 g/dL   RDW 73.4 (H) 28.7 - 68.1 %   Platelets 345 150 - 400 K/uL   nRBC 2.4 (H) 0.0 - 0.2 %    Comment: Performed at [facility], [address], [place], [place] [address]  Basic metabolic panel once in am     Status: Abnormal   Collection Time:  10/30/19  8:06 AM  Result Value Ref Range   Sodium 135 135 - 145 mmol/L   Potassium 3.4 (L) 3.5 - 5.1 mmol/L   Chloride 101 98 - 111 mmol/L   CO2 26 22 - 32 mmol/L   Glucose, Bld 102 (H) 70 - 99 mg/dL   BUN 8 6 - 20 mg/dL   Creatinine, Ser 8.650.63 0.44 - 1.00 mg/dL   Calcium 8.0  (L) 8.9 - 10.3 mg/dL   GFR calc non Af Amer >60 >60 mL/min   GFR calc Af Amer >60 >60 mL/min   Anion gap 8 5 - 15    Comment: Performed at [facility], [address] [place], [place] [address]    Radiology/Results: DG Chest 2 View  Result Date: 10/29/2019 CLINICAL DATA:  43 year old female old with history of cough. EXAM: CHEST - 2 VIEW COMPARISON:  No priors. FINDINGS: Left lower lobe airspace consolidation concerning for pneumonia. No pleural effusions. No evidence of pulmonary edema. Elevation of the right hemidiaphragm. Heart size is borderline enlarged. Upper mediastinal contours are within normal limits. IMPRESSION: 1. Left lower lobe pneumonia. Followup PA and lateral chest X-ray is recommended in 3-4 weeks following trial of antibiotic therapy to ensure resolution and exclude underlying malignancy. Electronically Signed   By: Trudie Reedaniel  Entrikin M.D.   On: 10/29/2019 10:11    Anti-infectives: Anti-infectives (From admission, onward)   Start     Dose/Rate Route Frequency Ordered Stop   10/27/19 1400  clindamycin (CLEOCIN) IVPB 600 mg  Status:  Discontinued     600 mg 100 mL/hr over 30 Minutes Intravenous Every 8 hours 10/27/19 1250 10/27/19 1251   10/27/19 1300  vancomycin (VANCOCIN) 1,250 mg in sodium chloride 0.9 % 250 mL IVPB     1,250 mg 166.7 mL/hr over 90 Minutes Intravenous Every 24 hours 10/27/19 1251     10/27/19 0800  vancomycin (VANCOCIN) 1,250 mg in sodium chloride 0.9 % 250 mL IVPB  Status:  Discontinued     1,250 mg 166.7 mL/hr over 90 Minutes Intravenous Every 24 hours 10/26/19 1410 10/26/19 1552   10/26/19 1430  piperacillin-tazobactam (ZOSYN) IVPB 3.375 g  Status:  Discontinued     3.375 g 12.5 mL/hr over 240 Minutes Intravenous Every 8 hours 10/26/19 1345 10/26/19 1350   10/26/19 1430  piperacillin-tazobactam (ZOSYN) IVPB 3.375 g     3.375 g 12.5 mL/hr over 240 Minutes Intravenous Every 8 hours 10/26/19 1350     10/26/19 1415  piperacillin-tazobactam  (ZOSYN) IVPB 3.375 g  Status:  Discontinued     3.375 g 100 mL/hr over 30 Minutes Intravenous Every 8 hours 10/26/19 1407 10/26/19 1410   10/26/19 1400  piperacillin-tazobactam (ZOSYN) IVPB 3.375 g  Status:  Discontinued     3.375 g 100 mL/hr over 30 Minutes Intravenous Every 8 hours 10/26/19 1336 10/26/19 1344   10/26/19 0600  piperacillin-tazobactam (ZOSYN) IVPB 3.375 g     3.375 g 100 mL/hr over 30 Minutes Intravenous  Once 10/26/19 0546 10/26/19 0654   10/26/19 0600  vancomycin (VANCOCIN) IVPB 1000 mg/200 mL premix     1,000 mg 200 mL/hr over 60 Minutes Intravenous  Once 10/26/19 0554 10/26/19 0702      Assessment/Plan: Problem List: Patient Active Problem List   Diagnosis Date Noted  . Left lower lobe pneumonia 10/29/2019  . Microcytic anemia 10/29/2019  . Iron deficiency anemia due to chronic blood loss 10/29/2019  . Sepsis (HCC) 10/29/2019  . Cholelithiasis 10/29/2019  . Biliary colic  10/29/2019  . Hyperglycemia due to diabetes mellitus ([place]) 10/29/2019  . Necrotizing fasciitis ([place]) 10/26/2019  . Type 2 diabetes mellitus without complication ([place]) 70/76/1518    No cause found for anemia that was present at admission.  Hg today is 6.9.  Will repeat CT abdomen and pelvis with oral and IV contrast.  Alternative would be BE with air but that doesn't seem to be in the menu anymore.   4 Days Post-Op    LOS: 3 days   Matt B. Hassell Done, MD, [facility] [phone] beeper [phone]  10/30/2019 9:19 AM

## 2019-10-30 NOTE — Progress Notes (Signed)
[facility]  PROGRESS NOTE  Morgan Williamson ZOX:[id number] DOB: [birth year] DOA: 10/25/2019 PCP: System, Pcp Not In Admit date - 10/25/2019   Admitting Physician Md Montez Morita, MD  Outpatient Primary MD for the patient is System, Pcp Not In  LOS - 3 Brief Narrative   Morgan Williamson is a 43 y.o. year old female with medical history significant for diabetes on no home medications who presented on 10/25/2019 with RUQ abdominal pain with associated nausea/vomiting constipation x1 day and left groin boil of 1 week and high blood sugar and was found to have a necrotizing soft tissue infection left perineum.  Patient was empirically started on vancomycin and Zosyn and admitted by general surgery for urgent operative incision and debridement by Dr Corliss Skains on 12/8  [facility] was consulted on 12/8 to assist in management of her diabetes.   Hospital course complicated by left lower pneumonia    Subjective   Used IPAD Video Interpreter for conversation this am.  Mrs.  Morgan Williamson today mild cough. Tolerating diet. Pain well controlled. Using IS A & P   Left lower lobe pna. Confirmed on CXR, no hypoxia, breathing comfortably -currently on broad spectrum antibiotics --encouraged incentive spirometry,  albuterol PRN  Type 2 diabetes, improved blood glucose over last 24 hours.  A1c 11.8%.  Fasting blood sugar this a.m.91 no documented hypoglycemic episodes. -Continue Lantus 24 units for now, closely monitor CBGs, continue scheduled NovoLog with meals as long as any greater than 50%.  Sliding scale as needed. -Stressed importance of weight loss, close follow-up as outpatient.  Microcytic anemia, secondary to iron deficiency anemia (confirmed on iron panel), likely combination of menorrhagia and blood loss from recent operation.  Hemoglobin on arrival 9.5.  Has slowly down trended from 8.2 to 6.9 this a.m.No findings of hemorrhage on CT abdomen -Status post Feraheme x2 on 12/9 -Daily CBC, blood transfusion for goal  hemoglobin greater than 7--will order 1 U PRBC  Hypokalemia, persists.  Magnesium within normal limits.  Not having any current vomiting. -will supplement, already on daily potassium ( 20 meq BID) follow daily BMP  Sepsis secondary to necrotizing fasciitis of left perineum.  Status post I&D on 12/8.  Remains afebrile, hemodynamically stable, sepsis physiology resolved.  White count is downtrending. -Continue empiric vancomycin and Zosyn per surgical team -Blood cultures unremarkable -Intraoperative cultures pending showing E.faecalis and Strep anginosus, recommend ID consult for appropriate antibiotics  Cholelithiasis with biliary colic. Improvement in nausea, no abdominal pain, tolerating oral diet -Primary advancing diet from clear liquids to carb modified, closely monitor -Possible lap chole this admission, per surgery  Hypocalcemia.  Measured calcium of 7.6, corrected calcium of 9.3 (albumin 1.9) -Continue to closely monitor  Stress Urine incontinence. Has episodes in setting of cough from pna mentioned above. Ua shows WBC 11-20, but no bacteria and patient denies dysuria. Additionally already on broad spectrum antibiotics -monitor   Family Communication  :  None at bedside  Code Status :  FULL  Disposition Plan  :  Continued admission with IV antibiotics, close monitoring of hgb, left perineum infection, pna.    Lab Results  Component Value Date   PLT 345 10/30/2019    Diet :  Diet Order            Diet Carb Modified Fluid consistency: Thin; Room service appropriate? Yes  Diet effective now               Inpatient Medications Scheduled Meds: . acetaminophen  650 mg Oral Q6H  .  Chlorhexidine Gluconate Cloth  6 each Topical Once   And  . Chlorhexidine Gluconate Cloth  6 each Topical Once  . docusate sodium  100 mg Oral BID  . enoxaparin (LOVENOX) injection  40 mg Subcutaneous Q24H  . insulin aspart  0-9 Units Subcutaneous TID WC  . insulin aspart  5 Units  Subcutaneous TID WC  . insulin detemir  24 Units Subcutaneous Daily  . multivitamin with minerals  1 tablet Oral Daily  . pantoprazole  40 mg Oral Q1200  . polyethylene glycol  17 g Oral Daily  . potassium chloride  20 mEq Oral BID PC   Continuous Infusions: . ferumoxytol 510 mg (10/27/19 1759)  . lactated ringers 10 mL/hr at 10/26/19 1030  . piperacillin-tazobactam (ZOSYN)  IV 3.375 g (10/30/19 1301)  . vancomycin 1,250 mg (10/30/19 1116)   PRN Meds:.albuterol, bisacodyl, diphenhydrAMINE **OR** diphenhydrAMINE, methocarbamol, morphine injection, ondansetron **OR** ondansetron (ZOFRAN) IV, oxyCODONE  Antibiotics  :   Anti-infectives (From admission, onward)   Start     Dose/Rate Route Frequency Ordered Stop   10/27/19 1400  clindamycin (CLEOCIN) IVPB 600 mg  Status:  Discontinued     600 mg 100 mL/hr over 30 Minutes Intravenous Every 8 hours 10/27/19 1250 10/27/19 1251   10/27/19 1300  vancomycin (VANCOCIN) 1,250 mg in sodium chloride 0.9 % 250 mL IVPB     1,250 mg 166.7 mL/hr over 90 Minutes Intravenous Every 24 hours 10/27/19 1251     10/27/19 0800  vancomycin (VANCOCIN) 1,250 mg in sodium chloride 0.9 % 250 mL IVPB  Status:  Discontinued     1,250 mg 166.7 mL/hr over 90 Minutes Intravenous Every 24 hours 10/26/19 1410 10/26/19 1552   10/26/19 1430  piperacillin-tazobactam (ZOSYN) IVPB 3.375 g  Status:  Discontinued     3.375 g 12.5 mL/hr over 240 Minutes Intravenous Every 8 hours 10/26/19 1345 10/26/19 1350   10/26/19 1430  piperacillin-tazobactam (ZOSYN) IVPB 3.375 g     3.375 g 12.5 mL/hr over 240 Minutes Intravenous Every 8 hours 10/26/19 1350     10/26/19 1415  piperacillin-tazobactam (ZOSYN) IVPB 3.375 g  Status:  Discontinued     3.375 g 100 mL/hr over 30 Minutes Intravenous Every 8 hours 10/26/19 1407 10/26/19 1410   10/26/19 1400  piperacillin-tazobactam (ZOSYN) IVPB 3.375 g  Status:  Discontinued     3.375 g 100 mL/hr over 30 Minutes Intravenous Every 8 hours  10/26/19 1336 10/26/19 1344   10/26/19 0600  piperacillin-tazobactam (ZOSYN) IVPB 3.375 g     3.375 g 100 mL/hr over 30 Minutes Intravenous  Once 10/26/19 0546 10/26/19 0654   10/26/19 0600  vancomycin (VANCOCIN) IVPB 1000 mg/200 mL premix     1,000 mg 200 mL/hr over 60 Minutes Intravenous  Once 10/26/19 0554 10/26/19 0702       Objective   Vitals:   10/29/19 0431 10/29/19 2143 10/30/19 0601 10/30/19 1424  BP: 119/83 114/74 101/67 103/64  Pulse: (!) 102 92 88 (!) 101  Resp: 19 (!) Temp: 98.3 F (36.8 C) 98.9 F (37.2 C) 97.8 F (36.6 C) 98 F (36.7 C)  TempSrc: Oral Oral Oral Oral  SpO2: 98% 91% 96% 98%  Weight:      Height:        SpO2: 98 % O2 Flow Rate (L/min): 2 L/min  Wt Readings from Last 3 Encounters:  10/26/19 81.6 kg     Intake/Output Summary (Last 24 hours) at 10/30/2019 1457 Last data filed at  10/30/2019 1300 Gross per 24 hour  Intake 320 ml  Output --  Net 320 ml    Physical Exam:   Sitting in bedside chair comfortably Awake Alert, Oriented X 3, Normal affect No new F.N deficits,  [place].AT, Moist oral mucosa Symmetrical Chest wall movement, Good air movement bilaterally on room air Normal rate,No Gallops,Rubs or new Murmurs,  +ve B.Sounds, Abd Soft, No tenderness, no guarding or rigidity. Decrease in nonblanching redness and tenderness of left thigh     I have personally reviewed the following:   Data Reviewed:  CBC Recent Labs  Lab 10/25/19 2205 10/27/19 0148 10/28/19 0134 10/29/19 0404 10/30/19 0806  WBC 22.0* 18.7* 14.3* 14.4* 15.7*  HGB 9.5* 8.2* 7.4* 7.2* 6.9*  HCT 31.4* 27.1* 24.6* 23.3* 21.9*  PLT 348 307 255 331 345  MCV 75.7* 75.9* 75.9* 74.2* 73.7*  MCH 22.9* 23.0* 22.8* 22.9* 23.2*  MCHC 30.3 30.3 30.1 30.9 31.5  RDW 17.4* 17.5* 17.7* 17.9* 18.1*    Chemistries  Recent Labs  Lab 10/25/19 2205 10/26/19 0455 10/27/19 0148 10/28/19 0134 10/29/19 0404 10/30/19 0806  NA 131*  --  137 133* 135 135  K  3.8  --  3.7 3.4* 3.1* 3.4*  CL 96*  --  104 102 102 101  CO2 22  --  21* 21* 25 26  GLUCOSE 320*  --  253* 147* 110* 102*  BUN 13  --  17 17 10 8   CREATININE 0.68  --  0.75 0.59 0.49 0.63  CALCIUM 8.7*  --  7.6* 7.4* 7.6* 8.0*  MG  --   --   --   --  2.1  --   AST  --  53*  --  38 35  --   ALT  --  21  --  26 34  --   ALKPHOS  --  102  --  89 110  --   BILITOT  --  1.7*  --  0.4 0.6  --    ------------------------------------------------------------------------------------------------------------------ No results for input(s): CHOL, HDL, LDLCALC, TRIG, CHOLHDL, LDLDIRECT in the last 72 hours.  Lab Results  Component Value Date   HGBA1C 11.8 (H) 10/26/2019   ------------------------------------------------------------------------------------------------------------------ No results for input(s): TSH, T4TOTAL, T3FREE, THYROIDAB in the last 72 hours.  Invalid input(s): FREET3 ------------------------------------------------------------------------------------------------------------------ No results for input(s): VITAMINB12, FOLATE, FERRITIN, TIBC, IRON, RETICCTPCT in the last 72 hours.  Coagulation profile No results for input(s): INR, PROTIME in the last 168 hours.  No results for input(s): DDIMER in the last 72 hours.  Cardiac Enzymes No results for input(s): CKMB, TROPONINI, MYOGLOBIN in the last 168 hours.  Invalid input(s): CK ------------------------------------------------------------------------------------------------------------------ No results found for: BNP  Micro Results Recent Results (from the past 240 hour(s))  Blood culture (routine x 2)     Status: None (Preliminary result)   Collection Time: 10/26/19  4:54 AM   Specimen: BLOOD  Result Value Ref Range Status   Specimen Description BLOOD LEFT ANTECUBITAL  Final   Special Requests   Final    BOTTLES DRAWN AEROBIC AND ANAEROBIC Blood Culture results may not be optimal due to an excessive volume of blood  received in culture bottles   Culture   Final    NO GROWTH 4 DAYS Performed at [facility], [address], [place], [place]    Report Status PENDING  Incomplete  Blood culture (routine x 2)     Status: None (Preliminary result)   Collection Time: 10/26/19  4:54 AM   Specimen: BLOOD  Result Value Ref Range Status   Specimen Description BLOOD RIGHT ANTECUBITAL  Final   Special Requests   Final    BOTTLES DRAWN AEROBIC AND ANAEROBIC Blood Culture results may not be optimal due to an excessive volume of blood received in culture bottles   Culture   Final    NO GROWTH 4 DAYS Performed at [facility], [address], [place], [place] [address]    Report Status PENDING  Incomplete  Respiratory Panel by RT PCR (Flu A&B, Covid) - Nasopharyngeal Swab     Status: None   Collection Time: 10/26/19  6:58 AM   Specimen: Nasopharyngeal Swab  Result Value Ref Range Status   SARS Coronavirus 2 by RT PCR NEGATIVE NEGATIVE Final    Comment: (NOTE) SARS-CoV-2 target nucleic acids are NOT DETECTED. The SARS-CoV-2 RNA is generally detectable in upper respiratoy specimens during the acute phase of infection. The lowest concentration of SARS-CoV-2 viral copies this assay can detect is 131 copies/mL. A negative result does not preclude SARS-Cov-2 infection and should not be used as the sole basis for treatment or other patient management decisions. A negative result may occur with  improper specimen collection/handling, submission of specimen other than nasopharyngeal swab, presence of viral mutation(s) within the areas targeted by this assay, and inadequate number of viral copies (<131 copies/mL). A negative result must be combined with clinical observations, patient history, and epidemiological information. The expected result is Negative. Fact Sheet for Patients:  https://www.moore.com/ Fact Sheet for Healthcare Providers:    https://www.young.biz/ This test is not yet ap proved or cleared by the [place] FDA and  has been authorized for detection and/or diagnosis of SARS-CoV-2 by FDA under an Emergency Use Authorization (EUA). This EUA will remain  in effect (meaning this test can be used) for the duration of the COVID-19 declaration under Section 564(b)(1) of the Act, 21 U.S.C. section 360bbb-3(b)(1), unless the authorization is terminated or revoked sooner.    Influenza A by PCR NEGATIVE NEGATIVE Final   Influenza B by PCR NEGATIVE NEGATIVE Final    Comment: (NOTE) The Xpert Xpress SARS-CoV-2/FLU/RSV assay is intended as an aid in  the diagnosis of influenza from Nasopharyngeal swab specimens and  should not be used as a sole basis for treatment. Nasal washings and  aspirates are unacceptable for Xpert Xpress SARS-CoV-2/FLU/RSV  testing. Fact Sheet for Patients: https://www.moore.com/ Fact Sheet for Healthcare Providers: https://www.young.biz/ This test is not yet approved or cleared by the [place] FDA and  has been authorized for detection and/or diagnosis of SARS-CoV-2 by  FDA under an Emergency Use Authorization (EUA). This EUA will remain  in effect (meaning this test can be used) for the duration of the  Covid-19 declaration under Section 564(b)(1) of the Act, 21  U.S.C. section 360bbb-3(b)(1), unless the authorization is  terminated or revoked. Performed at [facility], [address] [place], [place] [address]   Aerobic/Anaerobic Culture (surgical/deep wound)     Status: None (Preliminary result)   Collection Time: 10/26/19 12:33 PM   Specimen: PATH Other; Tissue  Result Value Ref Range Status   Specimen Description GROIN LEFT  Final   Special Requests NONE  Final   Gram Stain   Final    ABUNDANT WBC PRESENT, PREDOMINANTLY PMN ABUNDANT GRAM NEGATIVE RODS ABUNDANT GRAM POSITIVE COCCI    Culture   Final     MODERATE STREPTOCOCCUS ANGINOSIS FEW ENTEROCOCCUS FAECALIS NO ANAEROBES ISOLATED  Performed at [facility], [place] [address] [place], [place] [address]    Report Status PENDING  Incomplete   Organism ID, Bacteria STREPTOCOCCUS ANGINOSIS  Final   Organism ID, Bacteria ENTEROCOCCUS FAECALIS  Final      Susceptibility   Enterococcus faecalis - MIC*    AMPICILLIN <=2 SENSITIVE Sensitive     VANCOMYCIN 1 SENSITIVE Sensitive     GENTAMICIN SYNERGY RESISTANT Resistant     * FEW ENTEROCOCCUS FAECALIS   Streptococcus anginosis - MIC*    PENICILLIN 0.12 SENSITIVE Sensitive     CEFTRIAXONE 1 SENSITIVE Sensitive     ERYTHROMYCIN 2 RESISTANT Resistant     LEVOFLOXACIN 1 SENSITIVE Sensitive     VANCOMYCIN 0.5 SENSITIVE Sensitive     * MODERATE STREPTOCOCCUS ANGINOSIS  Culture, Urine     Status: None   Collection Time: 10/29/19  8:50 AM   Specimen: Urine, Clean Catch  Result Value Ref Range Status   Specimen Description URINE, CLEAN CATCH  Final   Special Requests NONE  Final   Culture   Final    NO GROWTH Performed at [facility], [place] [address] [place], [place] [address]    Report Status 10/30/2019 FINAL  Final    Radiology Reports DG Chest 2 View  Result Date: 10/29/2019 CLINICAL DATA:  43 year old female old with history of cough. EXAM: CHEST - 2 VIEW COMPARISON:  No priors. FINDINGS: Left lower lobe airspace consolidation concerning for pneumonia. No pleural effusions. No evidence of pulmonary edema. Elevation of the right hemidiaphragm. Heart size is borderline enlarged. Upper mediastinal contours are within normal limits. IMPRESSION: 1. Left lower lobe pneumonia. Followup PA and lateral chest X-ray is recommended in 3-4 weeks following trial of antibiotic therapy to ensure resolution and exclude underlying malignancy. Electronically Signed   By: Vinnie Langton M.D.   On: 10/29/2019 10:11   CT ABDOMEN PELVIS W CONTRAST  Result Date: 10/30/2019 CLINICAL DATA:  Anemia.  Pt is 4 days post-op. Looking for possible source of bleeding that is causing anemia. Incision and drainage perirectal abscess on 10/26/2019. EXAM: CT ABDOMEN AND PELVIS WITH CONTRAST TECHNIQUE: Multidetector CT imaging of the abdomen and pelvis was performed using the standard protocol following bolus administration of intravenous contrast. CONTRAST:  127mL OMNIPAQUE IOHEXOL 300 MG/ML  SOLN COMPARISON:  10/26/2019 FINDINGS: Lower chest: Small right pleural effusion. Minor dependent lung base atelectasis. Heart normal in size. Hepatobiliary: Liver unremarkable. Large gallstone, unchanged. No evidence of acute cholecystitis. No bile duct dilation. Pancreas: Unremarkable. No pancreatic ductal dilatation or surrounding inflammatory changes. Spleen: Normal in size without focal abnormality. Adrenals/Urinary Tract: No adrenal masses. Kidneys normal size, orientation and position with symmetric enhancement and excretion. No masses, stones or hydronephrosis. Normal ureters. Normal bladder. Stomach/Bowel: Stomach is unremarkable. Small bowel and colon are normal in caliber. No wall thickening or inflammation. No evidence of a bowel mass. Normal appendix visualized. Vascular/Lymphatic: No significant vascular abnormality. No lymphadenopathy. Reproductive: Soft tissue air tracks along the left groin and inguinal canal, less extensive than on the prior CT. No defined fluid collection to suggest an abscess. Uterus and adnexa are unremarkable. Other: No evidence of retroperitoneal or intraperitoneal or pelvic hemorrhage. Musculoskeletal: No acute or significant osseous findings. IMPRESSION: 1. No findings to account for anemia. No evidence of hemorrhage and no bowel mass or inflammation. 2. Soft tissue air along the left inguinal region, partly imaged, with less air than noted on the prior CT. No fluid collection to suggest an abscess.  3. Gallstone without evidence of acute cholecystitis. 4. Small right pleural effusion new  since the prior CT. Electronically Signed   By: Amie Portlandavid  Ormond M.D.   On: 10/30/2019 14:19   CT ABDOMEN PELVIS W CONTRAST  Result Date: 10/26/2019 CLINICAL DATA:  Acute generalized abdominal pain EXAM: CT ABDOMEN AND PELVIS WITH CONTRAST TECHNIQUE: Multidetector CT imaging of the abdomen and pelvis was performed using the standard protocol following bolus administration of intravenous contrast. CONTRAST:  100mL OMNIPAQUE IOHEXOL 300 MG/ML  SOLN COMPARISON:  None. FINDINGS: Lower chest:  No contributory findings. Hepatobiliary: No focal liver abnormality.Cholelithiasis and gallbladder sludge. No evidence of gallbladder inflammation. No bile duct dilatation. Pancreas: Unremarkable. Spleen: Unremarkable. Adrenals/Urinary Tract: Negative adrenals. No hydronephrosis or stone. Unremarkable bladder. Stomach/Bowel:  No obstruction. No appendicitis. Vascular/Lymphatic: No acute vascular abnormality. No mass or adenopathy. Reproductive:No pathologic findings. Other: No ascites or pneumoperitoneum. Soft tissue gas in the visualized left deep groin fat with tracking along the inguinal canal and into the mons pubis/labia, incompletely covered. No organized collection. Musculoskeletal: No acute abnormalities. These results were called by telephone at the time of interpretation on 10/26/2019 at 5:48 am to provider [facility] , who verbally acknowledged these results. IMPRESSION: 1. Partially covered soft tissue gas in the deep fat of the left groin and perineum compatible with necrotizing infection. No visualized abscess ; there is incomplete coverage. 2. Cholelithiasis without signs of cholecystitis. Electronically Signed   By: Marnee SpringJonathon  Watts M.D.   On: 10/26/2019 05:49     Time Spent in minutes  30     Laverna PeaceShayla D Nyal Schachter M.D on 10/30/2019 at 2:57 PM  To page go to [email or web address] - password [email or web address]

## 2019-10-31 LAB — BASIC METABOLIC PANEL
Anion gap: 9 (ref 5–15)
BUN: 5 mg/dL — ABNORMAL LOW (ref 6–20)
CO2: 24 mmol/L (ref 22–32)
Calcium: 8 mg/dL — ABNORMAL LOW (ref 8.9–10.3)
Chloride: 103 mmol/L (ref 98–111)
Creatinine, Ser: 0.47 mg/dL (ref 0.44–1.00)
GFR calc Af Amer: >60 mL/min (ref >60)
GFR calc non Af Amer: >60 mL/min (ref >60)
Glucose, Bld: 79 mg/dL (ref 70–99)
Potassium: 4 mmol/L (ref 3.5–5.1)
Sodium: 136 mmol/L (ref 135–145)

## 2019-10-31 LAB — TYPE AND SCREEN
ABO/RH(D): O POS
Antibody Screen: NEGATIVE
Unit division: 0

## 2019-10-31 LAB — CULTURE, BLOOD (ROUTINE X 2)
Culture: NO GROWTH
Culture: NO GROWTH

## 2019-10-31 LAB — GLUCOSE, CAPILLARY
Glucose-Capillary: 133 mg/dL — ABNORMAL HIGH (ref 70–99)
Glucose-Capillary: 122 mg/dL — ABNORMAL HIGH (ref 70–99)
Glucose-Capillary: 87 mg/dL (ref 70–99)
Glucose-Capillary: 114 mg/dL — ABNORMAL HIGH (ref 70–99)

## 2019-10-31 LAB — AEROBIC/ANAEROBIC CULTURE W GRAM STAIN (SURGICAL/DEEP WOUND)

## 2019-10-31 LAB — BPAM RBC
Blood Product Expiration Date: 202101112359
ISSUE DATE / TIME: 202012121847
Unit Type and Rh: 5100

## 2019-10-31 LAB — CBC
HCT: 25.6 % — ABNORMAL LOW (ref 36.0–46.0)
Hemoglobin: 8.3 g/dL — ABNORMAL LOW (ref 12.0–15.0)
MCH: 24.2 pg — ABNORMAL LOW (ref 26.0–34.0)
MCHC: 32.4 g/dL (ref 30.0–36.0)
MCV: 74.6 fL — ABNORMAL LOW (ref 80.0–100.0)
Platelets: 339 10*3/uL (ref 150–400)
RBC: 3.43 MIL/uL — ABNORMAL LOW (ref 3.87–5.11)
RDW: 18.6 % — ABNORMAL HIGH (ref 11.5–15.5)
WBC: 13.3 10*3/uL — ABNORMAL HIGH (ref 4.0–10.5)
nRBC: 2.7 % — ABNORMAL HIGH (ref 0.0–0.2)

## 2019-10-31 NOTE — Progress Notes (Signed)
Patient ID: Morgan Williamson, female   DOB: October 07, 1976, 43 y.o.   MRN: [id number] [facility] Surgery Progress Note:   5 Days Post-Op  Subjective: Mental status is alert;  Main discomfort in groin Objective: Vital signs in last 24 hours: Temp:  [97.6 F (36.4 C)-99.8 F (37.7 C)] 98.8 F (37.1 C) (12/13 0531) Pulse Rate:  [78-101] 78 (12/13 0532) Resp:  [18-20] 18 (12/13 0531) BP: (95-123)/(61-77) 100/61 (12/13 0532) SpO2:  [92 %-98 %] 93 % (12/13 0531)  Intake/Output from previous day: 12/12 0701 - 12/13 0700 In: 1462.8 [P.O.:320; IV Piggyback:1142.8] Out: -  Intake/Output this shift: No intake/output data recorded.  Physical Exam: Work of breathing is not labored;  No upper abdominal pain.  Mainly discomfort in left groin.    Lab Results:  Results for orders placed or performed during the hospital encounter of 10/25/19 (from the past 48 hour(s))  Glucose, capillary     Status: Abnormal   Collection Time: 10/29/19 12:25 PM  Result Value Ref Range   Glucose-Capillary 123 (H) 70 - 99 mg/dL  Glucose, capillary     Status: None   Collection Time: 10/29/19  5:01 PM  Result Value Ref Range   Glucose-Capillary 88 70 - 99 mg/dL  Glucose, capillary     Status: None   Collection Time: 10/29/19  9:46 PM  Result Value Ref Range   Glucose-Capillary 89 70 - 99 mg/dL  Glucose, capillary     Status: None   Collection Time: 10/30/19  7:47 AM  Result Value Ref Range   Glucose-Capillary 91 70 - 99 mg/dL  CBC     Status: Abnormal   Collection Time: 10/30/19  8:06 AM  Result Value Ref Range   WBC 15.7 (H) 4.0 - 10.5 K/uL   RBC 2.97 (L) 3.87 - 5.11 MIL/uL   Hemoglobin 6.9 (LL) 12.0 - 15.0 g/dL    Comment: REPEATED TO VERIFY Reticulocyte Hemoglobin testing may be clinically indicated, consider ordering this additional test [id number] THIS CRITICAL RESULT HAS VERIFIED AND BEEN CALLED TO D.ANGELITO,RN BY JESSICA WEBBER ON 12 12 2020 AT 0854, AND HAS BEEN READ BACK.     HCT 21.9 (L) 36.0  - 46.0 %   MCV 73.7 (L) 80.0 - 100.0 fL   MCH 23.2 (L) 26.0 - 34.0 pg   MCHC 31.5 30.0 - 36.0 g/dL   RDW 58.0 (H) 99.8 - 33.8 %   Platelets 345 150 - 400 K/uL   nRBC 2.4 (H) 0.0 - 0.2 %    Comment: Performed at [facility], [address], [place], [place] [address]  Basic metabolic panel once in am     Status: Abnormal   Collection Time: 10/30/19  8:06 AM  Result Value Ref Range   Sodium 135 135 - 145 mmol/L   Potassium 3.4 (L) 3.5 - 5.1 mmol/L   Chloride 101 98 - 111 mmol/L   CO2 26 22 - 32 mmol/L   Glucose, Bld 102 (H) 70 - 99 mg/dL   BUN 8 6 - 20 mg/dL   Creatinine, Ser 9.76 0.44 - 1.00 mg/dL   Calcium 8.0 (L) 8.9 - 10.3 mg/dL   GFR calc non Af Amer >60 >60 mL/min   GFR calc Af Amer >60 >60 mL/min   Anion gap 8 5 - 15    Comment: Performed at [facility], [address], [place], [place] [address]  Glucose, capillary     Status: Abnormal   Collection Time: 10/30/19 12:48  PM  Result Value Ref Range   Glucose-Capillary 135 (H) 70 - 99 mg/dL  Prepare RBC     Status: None   Collection Time: 10/30/19  4:18 PM  Result Value Ref Range   Order Confirmation      ORDER PROCESSED BY BLOOD BANK Performed at [facility], [place] [address] [place], [place] [address]   Type and screen [place]     Status: None   Collection Time: 10/30/19  4:25 PM  Result Value Ref Range   ABO/RH(D) O POS    Antibody Screen NEG    Sample Expiration 11/02/2019,2359    Unit Number [id number]    Blood Component Type RED CELLS,LR    Unit division 00    Status of Unit ISSUED,FINAL    Transfusion Status OK TO TRANSFUSE    Crossmatch Result      Compatible Performed at [facility], [place] [address], [place], [place] [address]   ABO/Rh     Status: None   Collection Time: 10/30/19  4:25 PM  Result Value Ref Range   ABO/RH(D)      O POS Performed at [place] [address] [place], [place] [address]   Glucose, capillary     Status: Abnormal    Collection Time: 10/30/19  5:01 PM  Result Value Ref Range   Glucose-Capillary 126 (H) 70 - 99 mg/dL  Glucose, capillary     Status: None   Collection Time: 10/30/19  8:56 PM  Result Value Ref Range   Glucose-Capillary 88 70 - 99 mg/dL  CBC     Status: Abnormal   Collection Time: 10/31/19 12:16 AM  Result Value Ref Range   WBC 13.3 (H) 4.0 - 10.5 K/uL   RBC 3.43 (L) 3.87 - 5.11 MIL/uL   Hemoglobin 8.3 (L) 12.0 - 15.0 g/dL    Comment: Reticulocyte Hemoglobin testing may be clinically indicated, consider ordering this additional test [id number]    HCT 25.6 (L) 36.0 - 46.0 %   MCV 74.6 (L) 80.0 - 100.0 fL   MCH 24.2 (L) 26.0 - 34.0 pg   MCHC 32.4 30.0 - 36.0 g/dL   RDW 18.6 (H) 11.5 - 15.5 %   Platelets 339 150 - 400 K/uL   nRBC 2.7 (H) 0.0 - 0.2 %    Comment: Performed at [facility], [place] [address], [place], [place] [address]  Basic metabolic panel     Status: Abnormal   Collection Time: 10/31/19 12:16 AM  Result Value Ref Range   Sodium 136 135 - 145 mmol/L   Potassium 4.0 3.5 - 5.1 mmol/L   Chloride 103 98 - 111 mmol/L   CO2 24 22 - 32 mmol/L   Glucose, Bld 79 70 - 99 mg/dL   BUN 5 (L) 6 - 20 mg/dL   Creatinine, Ser 0.47 0.44 - 1.00 mg/dL   Calcium 8.0 (L) 8.9 - 10.3 mg/dL   GFR calc non Af Amer >60 >60 mL/min   GFR calc Af Amer >60 >60 mL/min   Anion gap 9 5 - 15    Comment: Performed at [facility], [place] [address], [place], [place] [address]  Glucose, capillary     Status: Abnormal   Collection Time: 10/31/19  8:10 AM  Result Value Ref Range   Glucose-Capillary 133 (H) 70 - 99 mg/dL    Radiology/Results: DG Chest 2 View  Result Date: 10/29/2019 CLINICAL DATA:  43 year old  female old with history of cough. EXAM: CHEST - 2 VIEW COMPARISON:  No priors. FINDINGS: Left lower lobe airspace consolidation concerning for pneumonia. No pleural effusions. No evidence of pulmonary edema. Elevation of the right hemidiaphragm. Heart size is borderline enlarged.  Upper mediastinal contours are within normal limits. IMPRESSION: 1. Left lower lobe pneumonia. Followup PA and lateral chest X-ray is recommended in 3-4 weeks following trial of antibiotic therapy to ensure resolution and exclude underlying malignancy. Electronically Signed   By: Trudie Reed M.D.   On: 10/29/2019 10:11   CT ABDOMEN PELVIS W CONTRAST  Result Date: 10/30/2019 CLINICAL DATA:  Anemia. Pt is 4 days post-op. Looking for possible source of bleeding that is causing anemia. Incision and drainage perirectal abscess on 10/26/2019. EXAM: CT ABDOMEN AND PELVIS WITH CONTRAST TECHNIQUE: Multidetector CT imaging of the abdomen and pelvis was performed using the standard protocol following bolus administration of intravenous contrast. CONTRAST:  OMNIPAQUE IOHEXOL 300 MG/ML  SOLN COMPARISON:  10/26/2019 FINDINGS: Lower chest: Small right pleural effusion. Minor dependent lung base atelectasis. Heart normal in size. Hepatobiliary: Liver unremarkable. Large gallstone, unchanged. No evidence of acute cholecystitis. No bile duct dilation. Pancreas: Unremarkable. No pancreatic ductal dilatation or surrounding inflammatory changes. Spleen: Normal in size without focal abnormality. Adrenals/Urinary Tract: No adrenal masses. Kidneys normal size, orientation and position with symmetric enhancement and excretion. No masses, stones or hydronephrosis. Normal ureters. Normal bladder. Stomach/Bowel: Stomach is unremarkable. Small bowel and colon are normal in caliber. No wall thickening or inflammation. No evidence of a bowel mass. Normal appendix visualized. Vascular/Lymphatic: No significant vascular abnormality. No lymphadenopathy. Reproductive: Soft tissue air tracks along the left groin and inguinal canal, less extensive than on the prior CT. No defined fluid collection to suggest an abscess. Uterus and adnexa are unremarkable. Other: No evidence of retroperitoneal or intraperitoneal or pelvic hemorrhage.  Musculoskeletal: No acute or significant osseous findings. IMPRESSION: 1. No findings to account for anemia. No evidence of hemorrhage and no bowel mass or inflammation. 2. Soft tissue air along the left inguinal region, partly imaged, with less air than noted on the prior CT. No fluid collection to suggest an abscess. 3. Gallstone without evidence of acute cholecystitis. 4. Small right pleural effusion new since the prior CT. Electronically Signed   By: Amie Portland M.D.   On: 10/30/2019 14:19    Anti-infectives: Anti-infectives (From admission, onward)   Start     Dose/Rate Route Frequency Ordered Stop   10/27/19 1400  clindamycin (CLEOCIN) IVPB 600 mg  Status:  Discontinued     600 mg 100 mL/hr over 30 Minutes Intravenous Every 8 hours 10/27/19 1250 10/27/19 1251   10/27/19 1300  vancomycin (VANCOCIN) 1,250 mg in sodium chloride 0.9 % 250 mL IVPB     1,250 mg 166.7 mL/hr over 90 Minutes Intravenous Every 24 hours 10/27/19 1251     10/27/19 0800  vancomycin (VANCOCIN) 1,250 mg in sodium chloride 0.9 % 250 mL IVPB  Status:  Discontinued     1,250 mg 166.7 mL/hr over 90 Minutes Intravenous Every 24 hours 10/26/19 1410 10/26/19 1552   10/26/19 1430  piperacillin-tazobactam (ZOSYN) IVPB 3.375 g  Status:  Discontinued     3.375 g 12.5 mL/hr over 240 Minutes Intravenous Every 8 hours 10/26/19 1345 10/26/19 1350   10/26/19 1430  piperacillin-tazobactam (ZOSYN) IVPB 3.375 g     3.375 g 12.5 mL/hr over 240 Minutes Intravenous Every 8 hours 10/26/19 1350     10/26/19 1415  piperacillin-tazobactam (ZOSYN)  IVPB 3.375 g  Status:  Discontinued     3.375 g 100 mL/hr over 30 Minutes Intravenous Every 8 hours 10/26/19 1407 10/26/19 1410   10/26/19 1400  piperacillin-tazobactam (ZOSYN) IVPB 3.375 g  Status:  Discontinued     3.375 g 100 mL/hr over 30 Minutes Intravenous Every 8 hours 10/26/19 1336 10/26/19 1344   10/26/19 0600  piperacillin-tazobactam (ZOSYN) IVPB 3.375 g     3.375 g 100 mL/hr over 30  Minutes Intravenous  Once 10/26/19 0546 10/26/19 0654   10/26/19 0600  vancomycin (VANCOCIN) IVPB 1000 mg/200 mL premix     1,000 mg 200 mL/hr over 60 Minutes Intravenous  Once 10/26/19 0554 10/26/19 0702      Assessment/Plan: Problem List: Patient Active Problem List   Diagnosis Date Noted  . Left lower lobe pneumonia 10/29/2019  . Microcytic anemia 10/29/2019  . Iron deficiency anemia due to chronic blood loss 10/29/2019  . Sepsis (HCC) 10/29/2019  . Cholelithiasis 10/29/2019  . Biliary colic 10/29/2019  . Hyperglycemia due to diabetes mellitus (HCC) 10/29/2019  . Necrotizing fasciitis (HCC) 10/26/2019  . Type 2 diabetes mellitus without complication (HCC) 10/26/2019    CT yesterday did not show a cause for her anemia.  No undrained abscess in groin.  Visual inspection reveals the 4 CM cut on the left.  There is some immediate brawny edema on the left for a few CM but no crepitus.  Not tender to palpation.   Continue dressing changes.  IV antibiotics for groin wound and pneumonia. 5 Days Post-Op    LOS: 4 days   Matt B. Daphine DeutscherMartin, MD, [facility] Inc[facility]ACS  [facility] [phone] beeper [phone]  10/31/2019 9:17 AM

## 2019-10-31 NOTE — Progress Notes (Signed)
[facility]  PROGRESS NOTE  Clark Clowdus CMK:[id number] DOB: May 27, 1976 DOA: 10/25/2019 PCP: System, Pcp Not In Admit date - 10/25/2019   Admitting Physician Md Montez Morita, MD  Outpatient Primary MD for the patient is System, Pcp Not In  LOS - 4 Brief Narrative   Linetta Regner is a 43 y.o. year old female with medical history significant for diabetes on no home medications who presented on 10/25/2019 with RUQ abdominal pain with associated nausea/vomiting constipation x1 day and left groin boil of 1 week and high blood sugar and was found to have a necrotizing soft tissue infection left perineum.  Patient was empirically started on vancomycin and Zosyn and admitted by general surgery for urgent operative incision and debridement by Dr Corliss Skains on 12/8  [facility] was consulted on 12/8 to assist in management of her diabetes.   Hospital course complicated by left lower pneumonia    Subjective   Used IPAD Video Interpreter for conversation this am.  Mrs.  Kalisz today mild cough. Tolerating diet. Pain well controlled. Using IS. Some dizziness with lantus A & P   Left lower lobe pna. Confirmed on CXR, no hypoxia, breathing comfortably -currently on broad spectrum antibiotics --encouraged incentive spirometry,  albuterol PRN  Type 2 diabetes, improved blood glucose over last 24 hours.  A1c 11.8%.  Fasting blood sugar this a.m. 133 no documented hypoglycemic episodes. -Continue Lantus 24 units, closely monitor CBGs, continue scheduled NovoLog with meals as long as any greater than 50%.  Sliding scale as needed. -Stressed importance of weight loss, close follow-up as outpatient.  Microcytic anemia, secondary to iron deficiency anemia (confirmed on iron panel), likely combination of menorrhagia and blood loss from recent operation.  Hemoglobin on arrival 9.5.  Required 1 unit packed red blood cell for hemoglobin 6.9 on 12/12.  Hemoglobin now stable 8.3 without any bleeding episodes.   -Status post  Feraheme x2 on 12/9 -Daily CBC, blood transfusion for goal hemoglobin greater than 7  Hypokalemia, resolved magnesium within normal limits.  Not having any current vomiting. -on daily potassium ( 20 meq BID),follow daily BMP  Sepsis secondary to necrotizing fasciitis of left perineum (multifactorial, Streptococcus anginosus, E faecalis).  Status post I&D on 12/8.  Remains afebrile, hemodynamically stable, sepsis physiology resolved.  White count is downtrending. -Continue vancomycin and Zosyn per surgical team -Blood cultures unremarkable -Intraoperative cultures showing E.faecalis and Strep anginosus both sensitive to vancomycin  Cholelithiasis with biliary colic. Improvement in nausea, no abdominal pain, tolerating oral diet -Closely monitor per primary -Possible lap chole this admission, per surgery  Hypocalcemia.  Measured calcium of 7.6, corrected calcium of 9.3 (albumin 1.9) -Continue to closely monitor  Stress Urine incontinence. Has episodes in setting of cough from pna mentioned above. Ua shows WBC 11-20, but no bacteria and patient denies dysuria. Additionally already on broad spectrum antibiotics -monitor   Family Communication  :  None at bedside  Code Status :  FULL  Disposition Plan  :  Continued admission with IV antibiotics, close monitoring of hgb, left perineum infection, pna.    Lab Results  Component Value Date   PLT 339 10/31/2019    Diet :  Diet Order            Diet Carb Modified Fluid consistency: Thin; Room service appropriate? Yes  Diet effective now               Inpatient Medications Scheduled Meds: . acetaminophen  650 mg Oral Q6H  . Chlorhexidine Gluconate Cloth  6 each Topical Once   And  . Chlorhexidine Gluconate Cloth  6 each Topical Once  . docusate sodium  100 mg Oral BID  . enoxaparin (LOVENOX) injection  40 mg Subcutaneous Q24H  . insulin aspart  0-9 Units Subcutaneous TID WC  . insulin aspart  5 Units Subcutaneous TID WC  .  insulin detemir  24 Units Subcutaneous Daily  . multivitamin with minerals  1 tablet Oral Daily  . pantoprazole  40 mg Oral Q1200  . polyethylene glycol  17 g Oral Daily  . potassium chloride  20 mEq Oral BID PC   Continuous Infusions: . ferumoxytol 510 mg (10/27/19 1759)  . lactated ringers 10 mL/hr at 10/26/19 1030  . piperacillin-tazobactam (ZOSYN)  IV 3.375 g (10/31/19 1357)  . vancomycin 1,250 mg (10/31/19 1227)   PRN Meds:.albuterol, bisacodyl, diphenhydrAMINE **OR** diphenhydrAMINE, methocarbamol, morphine injection, ondansetron **OR** ondansetron (ZOFRAN) IV, oxyCODONE  Antibiotics  :   Anti-infectives (From admission, onward)   Start     Dose/Rate Route Frequency Ordered Stop   10/27/19 1400  clindamycin (CLEOCIN) IVPB 600 mg  Status:  Discontinued     600 mg 100 mL/hr over 30 Minutes Intravenous Every 8 hours 10/27/19 1250 10/27/19 1251   10/27/19 1300  vancomycin (VANCOCIN) 1,250 mg in sodium chloride 0.9 % 250 mL IVPB     1,250 mg 166.7 mL/hr over 90 Minutes Intravenous Every 24 hours 10/27/19 1251     10/27/19 0800  vancomycin (VANCOCIN) 1,250 mg in sodium chloride 0.9 % 250 mL IVPB  Status:  Discontinued     1,250 mg 166.7 mL/hr over 90 Minutes Intravenous Every 24 hours 10/26/19 1410 10/26/19 1552   10/26/19 1430  piperacillin-tazobactam (ZOSYN) IVPB 3.375 g  Status:  Discontinued     3.375 g 12.5 mL/hr over 240 Minutes Intravenous Every 8 hours 10/26/19 1345 10/26/19 1350   10/26/19 1430  piperacillin-tazobactam (ZOSYN) IVPB 3.375 g     3.375 g 12.5 mL/hr over 240 Minutes Intravenous Every 8 hours 10/26/19 1350     10/26/19 1415  piperacillin-tazobactam (ZOSYN) IVPB 3.375 g  Status:  Discontinued     3.375 g 100 mL/hr over 30 Minutes Intravenous Every 8 hours 10/26/19 1407 10/26/19 1410   10/26/19 1400  piperacillin-tazobactam (ZOSYN) IVPB 3.375 g  Status:  Discontinued     3.375 g 100 mL/hr over 30 Minutes Intravenous Every 8 hours 10/26/19 1336 10/26/19 1344    10/26/19 0600  piperacillin-tazobactam (ZOSYN) IVPB 3.375 g     3.375 g 100 mL/hr over 30 Minutes Intravenous  Once 10/26/19 0546 10/26/19 0654   10/26/19 0600  vancomycin (VANCOCIN) IVPB 1000 mg/200 mL premix     1,000 mg 200 mL/hr over 60 Minutes Intravenous  Once 10/26/19 0554 10/26/19 0702       Objective   Vitals:   10/30/19 2126 10/31/19 0531 10/31/19 0532 10/31/19 1500  BP: 95/61 96/61 100/61 121/76  Pulse: 85 79 78 93  Resp: Temp: 99.8 F (37.7 C) 98.8 F (37.1 C)  98.7 F (37.1 C)  TempSrc: Oral Oral  Oral  SpO2: 95% 93%  92%  Weight:      Height:        SpO2: 92 % O2 Flow Rate (L/min): 1 L/min  Wt Readings from Last 3 Encounters:  10/26/19 81.6 kg     Intake/Output Summary (Last 24 hours) at 10/31/2019 1545 Last data filed at 10/31/2019 1300 Gross per 24 hour  Intake 520 ml  Output --  Net 520 ml    Physical Exam:   Lying in bed comfortably Awake Alert, Oriented X 3, Normal affect No new F.N deficits,  [place].AT, Moist oral mucosa Symmetrical Chest wall movement, Good air movement bilaterally on room air Normal rate,No Gallops,Rubs or new Murmurs,  +ve B.Sounds, Abd Soft, No tenderness, no guarding or rigidity. Decrease in nonblanching redness and tenderness of left thigh     I have personally reviewed the following:   Data Reviewed:  CBC Recent Labs  Lab 10/27/19 0148 10/28/19 0134 10/29/19 0404 10/30/19 0806 10/31/19 0016  WBC 18.7* 14.3* 14.4* 15.7* 13.3*  HGB 8.2* 7.4* 7.2* 6.9* 8.3*  HCT 27.1* 24.6* 23.3* 21.9* 25.6*  PLT 307 255 331 345 339  MCV 75.9* 75.9* 74.2* 73.7* 74.6*  MCH 23.0* 22.8* 22.9* 23.2* 24.2*  MCHC 30.3 30.1 30.9 31.5 32.4  RDW 17.5* 17.7* 17.9* 18.1* 18.6*    Chemistries  Recent Labs  Lab 10/26/19 0455 10/27/19 0148 10/28/19 0134 10/29/19 0404 10/30/19 0806 10/31/19 0016  NA  --  137 133* 135 135 136  K  --  3.7 3.4* 3.1* 3.4* 4.0  CL  --  104 102 102 101 103  CO2  --  21* 21* 25 26  24   GLUCOSE  --  253* 147* 110* 102* 79  BUN  --  17 17 10 8  5*  CREATININE  --  0.75 0.59 0.49 0.63 0.47  CALCIUM  --  7.6* 7.4* 7.6* 8.0* 8.0*  MG  --   --   --  2.1  --   --   AST 53*  --  38 35  --   --   ALT 21  --  26 34  --   --   ALKPHOS 102  --  89 110  --   --   BILITOT 1.7*  --  0.4 0.6  --   --    ------------------------------------------------------------------------------------------------------------------ No results for input(s): CHOL, HDL, LDLCALC, TRIG, CHOLHDL, LDLDIRECT in the last 72 hours.  Lab Results  Component Value Date   HGBA1C 11.8 (H) 10/26/2019   ------------------------------------------------------------------------------------------------------------------ No results for input(s): TSH, T4TOTAL, T3FREE, THYROIDAB in the last 72 hours.  Invalid input(s): FREET3 ------------------------------------------------------------------------------------------------------------------ No results for input(s): VITAMINB12, FOLATE, FERRITIN, TIBC, IRON, RETICCTPCT in the last 72 hours.  Coagulation profile No results for input(s): INR, PROTIME in the last 168 hours.  No results for input(s): DDIMER in the last 72 hours.  Cardiac Enzymes No results for input(s): CKMB, TROPONINI, MYOGLOBIN in the last 168 hours.  Invalid input(s): CK ------------------------------------------------------------------------------------------------------------------ No results found for: BNP  Micro Results Recent Results (from the past 240 hour(s))  Blood culture (routine x 2)     Status: None   Collection Time: 10/26/19  4:54 AM   Specimen: BLOOD  Result Value Ref Range Status   Specimen Description BLOOD LEFT ANTECUBITAL  Final   Special Requests   Final    BOTTLES DRAWN AEROBIC AND ANAEROBIC Blood Culture results may not be optimal due to an excessive volume of blood received in culture bottles   Culture   Final    NO GROWTH 5 DAYS Performed at [facility],  [address] [place], [place] [address]    Report Status 10/31/2019 FINAL  Final  Blood culture (routine x 2)     Status: None   Collection Time: 10/26/19  4:54 AM   Specimen: BLOOD  Result Value Ref Range Status   Specimen  Description BLOOD RIGHT ANTECUBITAL  Final   Special Requests   Final    BOTTLES DRAWN AEROBIC AND ANAEROBIC Blood Culture results may not be optimal due to an excessive volume of blood received in culture bottles   Culture   Final    NO GROWTH 5 DAYS Performed at [facility], [place] [address], [place], [place] [address]    Report Status 10/31/2019 FINAL  Final  Respiratory Panel by RT PCR (Flu A&B, Covid) - Nasopharyngeal Swab     Status: None   Collection Time: 10/26/19  6:58 AM   Specimen: Nasopharyngeal Swab  Result Value Ref Range Status   SARS Coronavirus 2 by RT PCR NEGATIVE NEGATIVE Final    Comment: (NOTE) SARS-CoV-2 target nucleic acids are NOT DETECTED. The SARS-CoV-2 RNA is generally detectable in upper respiratoy specimens during the acute phase of infection. The lowest concentration of SARS-CoV-2 viral copies this assay can detect is 131 copies/mL. A negative result does not preclude SARS-Cov-2 infection and should not be used as the sole basis for treatment or other patient management decisions. A negative result may occur with  improper specimen collection/handling, submission of specimen other than nasopharyngeal swab, presence of viral mutation(s) within the areas targeted by this assay, and inadequate number of viral copies (<131 copies/mL). A negative result must be combined with clinical observations, patient history, and epidemiological information. The expected result is Negative. Fact Sheet for Patients:  PinkCheek.be Fact Sheet for Healthcare Providers:  GravelBags.it This test is not yet ap proved or cleared by the [place] FDA and  has been authorized for detection  and/or diagnosis of SARS-CoV-2 by FDA under an Emergency Use Authorization (EUA). This EUA will remain  in effect (meaning this test can be used) for the duration of the COVID-19 declaration under Section 564(b)(1) of the Act, 21 U.S.C. section 360bbb-3(b)(1), unless the authorization is terminated or revoked sooner.    Influenza A by PCR NEGATIVE NEGATIVE Final   Influenza B by PCR NEGATIVE NEGATIVE Final    Comment: (NOTE) The Xpert Xpress SARS-CoV-2/FLU/RSV assay is intended as an aid in  the diagnosis of influenza from Nasopharyngeal swab specimens and  should not be used as a sole basis for treatment. Nasal washings and  aspirates are unacceptable for Xpert Xpress SARS-CoV-2/FLU/RSV  testing. Fact Sheet for Patients: PinkCheek.be Fact Sheet for Healthcare Providers: GravelBags.it This test is not yet approved or cleared by the [place] FDA and  has been authorized for detection and/or diagnosis of SARS-CoV-2 by  FDA under an Emergency Use Authorization (EUA). This EUA will remain  in effect (meaning this test can be used) for the duration of the  Covid-19 declaration under Section 564(b)(1) of the Act, 21  U.S.C. section 360bbb-3(b)(1), unless the authorization is  terminated or revoked. Performed at [facility], [place] [address], [place], [place] [address]   Aerobic/Anaerobic Culture (surgical/deep wound)     Status: None   Collection Time: 10/26/19 12:33 PM   Specimen: PATH Other; Tissue  Result Value Ref Range Status   Specimen Description GROIN LEFT  Final   Special Requests NONE  Final   Gram Stain   Final    ABUNDANT WBC PRESENT, PREDOMINANTLY PMN ABUNDANT GRAM NEGATIVE RODS ABUNDANT GRAM POSITIVE COCCI    Culture   Final    MODERATE STREPTOCOCCUS ANGINOSIS FEW ENTEROCOCCUS FAECALIS NO ANAEROBES ISOLATED Performed at [facility], [place] [address] [place], [place] [address]    Report  Status 10/31/2019 FINAL  Final   Organism ID, Bacteria STREPTOCOCCUS ANGINOSIS  Final   Organism ID, Bacteria ENTEROCOCCUS FAECALIS  Final      Susceptibility   Enterococcus faecalis - MIC*    AMPICILLIN <=2 SENSITIVE Sensitive     VANCOMYCIN 1 SENSITIVE Sensitive     GENTAMICIN SYNERGY RESISTANT Resistant     * FEW ENTEROCOCCUS FAECALIS   Streptococcus anginosis - MIC*    PENICILLIN 0.12 SENSITIVE Sensitive     CEFTRIAXONE 1 SENSITIVE Sensitive     ERYTHROMYCIN 2 RESISTANT Resistant     LEVOFLOXACIN 1 SENSITIVE Sensitive     VANCOMYCIN 0.5 SENSITIVE Sensitive     * MODERATE STREPTOCOCCUS ANGINOSIS  Culture, Urine     Status: None   Collection Time: 10/29/19  8:50 AM   Specimen: Urine, Clean Catch  Result Value Ref Range Status   Specimen Description URINE, CLEAN CATCH  Final   Special Requests NONE  Final   Culture   Final    NO GROWTH Performed at [facility], [address], [place], [place] [address]    Report Status 10/30/2019 FINAL  Final    Radiology Reports DG Chest 2 View  Result Date: 10/29/2019 CLINICAL DATA:  43 year old female old with history of cough. EXAM: CHEST - 2 VIEW COMPARISON:  No priors. FINDINGS: Left lower lobe airspace consolidation concerning for pneumonia. No pleural effusions. No evidence of pulmonary edema. Elevation of the right hemidiaphragm. Heart size is borderline enlarged. Upper mediastinal contours are within normal limits. IMPRESSION: 1. Left lower lobe pneumonia. Followup PA and lateral chest X-ray is recommended in 3-4 weeks following trial of antibiotic therapy to ensure resolution and exclude underlying malignancy. Electronically Signed   By: Trudie Reed M.D.   On: 10/29/2019 10:11   CT ABDOMEN PELVIS W CONTRAST  Result Date: 10/30/2019 CLINICAL DATA:  Anemia. Pt is 4 days post-op. Looking for possible source of bleeding that is causing anemia. Incision and drainage perirectal abscess on 10/26/2019. EXAM: CT ABDOMEN AND  PELVIS WITH CONTRAST TECHNIQUE: Multidetector CT imaging of the abdomen and pelvis was performed using the standard protocol following bolus administration of intravenous contrast. CONTRAST:  OMNIPAQUE IOHEXOL 300 MG/ML  SOLN COMPARISON:  10/26/2019 FINDINGS: Lower chest: Small right pleural effusion. Minor dependent lung base atelectasis. Heart normal in size. Hepatobiliary: Liver unremarkable. Large gallstone, unchanged. No evidence of acute cholecystitis. No bile duct dilation. Pancreas: Unremarkable. No pancreatic ductal dilatation or surrounding inflammatory changes. Spleen: Normal in size without focal abnormality. Adrenals/Urinary Tract: No adrenal masses. Kidneys normal size, orientation and position with symmetric enhancement and excretion. No masses, stones or hydronephrosis. Normal ureters. Normal bladder. Stomach/Bowel: Stomach is unremarkable. Small bowel and colon are normal in caliber. No wall thickening or inflammation. No evidence of a bowel mass. Normal appendix visualized. Vascular/Lymphatic: No significant vascular abnormality. No lymphadenopathy. Reproductive: Soft tissue air tracks along the left groin and inguinal canal, less extensive than on the prior CT. No defined fluid collection to suggest an abscess. Uterus and adnexa are unremarkable. Other: No evidence of retroperitoneal or intraperitoneal or pelvic hemorrhage. Musculoskeletal: No acute or significant osseous findings. IMPRESSION: 1. No findings to account for anemia. No evidence of hemorrhage and no bowel mass or inflammation. 2. Soft tissue air along the left inguinal region, partly imaged, with less air than noted on the prior CT. No fluid collection to suggest an abscess. 3. Gallstone without evidence of acute cholecystitis. 4. Small right pleural effusion new since the prior  CT. Electronically Signed   By: Amie Portland M.D.   On: 10/30/2019 14:19   CT ABDOMEN PELVIS W CONTRAST  Result Date: 10/26/2019 CLINICAL DATA:   Acute generalized abdominal pain EXAM: CT ABDOMEN AND PELVIS WITH CONTRAST TECHNIQUE: Multidetector CT imaging of the abdomen and pelvis was performed using the standard protocol following bolus administration of intravenous contrast. CONTRAST:  OMNIPAQUE IOHEXOL 300 MG/ML  SOLN COMPARISON:  None. FINDINGS: Lower chest:  No contributory findings. Hepatobiliary: No focal liver abnormality.Cholelithiasis and gallbladder sludge. No evidence of gallbladder inflammation. No bile duct dilatation. Pancreas: Unremarkable. Spleen: Unremarkable. Adrenals/Urinary Tract: Negative adrenals. No hydronephrosis or stone. Unremarkable bladder. Stomach/Bowel:  No obstruction. No appendicitis. Vascular/Lymphatic: No acute vascular abnormality. No mass or adenopathy. Reproductive:No pathologic findings. Other: No ascites or pneumoperitoneum. Soft tissue gas in the visualized left deep groin fat with tracking along the inguinal canal and into the mons pubis/labia, incompletely covered. No organized collection. Musculoskeletal: No acute abnormalities. These results were called by telephone at the time of interpretation on 10/26/2019 at 5:48 am to provider [facility] , who verbally acknowledged these results. IMPRESSION: 1. Partially covered soft tissue gas in the deep fat of the left groin and perineum compatible with necrotizing infection. No visualized abscess ; there is incomplete coverage. 2. Cholelithiasis without signs of cholecystitis. Electronically Signed   By: Marnee Spring M.D.   On: 10/26/2019 05:49     Time Spent in minutes  30     Laverna Peace M.D on 10/31/2019 at 3:45 PM  To page go to [email or web address] - password [facility]

## 2019-11-01 LAB — CBC
HCT: 26.3 % — ABNORMAL LOW (ref 36.0–46.0)
Hemoglobin: 8.3 g/dL — ABNORMAL LOW (ref 12.0–15.0)
MCH: 24.1 pg — ABNORMAL LOW (ref 26.0–34.0)
MCHC: 31.6 g/dL (ref 30.0–36.0)
MCV: 76.2 fL — ABNORMAL LOW (ref 80.0–100.0)
Platelets: 355 10*3/uL (ref 150–400)
RBC: 3.45 MIL/uL — ABNORMAL LOW (ref 3.87–5.11)
RDW: 18.7 % — ABNORMAL HIGH (ref 11.5–15.5)
WBC: 13 10*3/uL — ABNORMAL HIGH (ref 4.0–10.5)
nRBC: 1.5 % — ABNORMAL HIGH (ref 0.0–0.2)

## 2019-11-01 LAB — GLUCOSE, CAPILLARY
Glucose-Capillary: 144 mg/dL — ABNORMAL HIGH (ref 70–99)
Glucose-Capillary: 88 mg/dL (ref 70–99)
Glucose-Capillary: 150 mg/dL — ABNORMAL HIGH (ref 70–99)
Glucose-Capillary: 128 mg/dL — ABNORMAL HIGH (ref 70–99)

## 2019-11-01 MED ORDER — INSULIN PEN NEEDLE 31G X 5 MM MISC: 0 refills | Status: AC

## 2019-11-01 MED ORDER — BLOOD GLUCOSE METER KIT: PACK | 0 refills | Status: AC

## 2019-11-01 MED ORDER — AMOXICILLIN-POT CLAVULANATE 875-125 MG PO TABS
1.0000 | ORAL_TABLET | Freq: Two times a day (BID) | ORAL | Status: DC
  Administered 2019-11-01 – 2019-11-02 (×2): 1 via ORAL
  Filled 2019-11-01 (×2): qty 1

## 2019-11-01 MED ORDER — NOVOLIN 70/30 FLEXPEN RELION (70-30) 100 UNIT/ML ~~LOC~~ SUPN: 18.0000 [IU] | PEN_INJECTOR | Freq: Two times a day (BID) | SUBCUTANEOUS | 11 refills | Status: AC

## 2019-11-01 MED FILL — NOVOLIN 70/30 FLEXPEN (70-3: (70-30) 100 | 30 days supply | Qty: 12 | Fill #0

## 2019-11-01 MED FILL — TRUEplus LANCETS 28G MISC: 25 days supply | Qty: 100 | Fill #0

## 2019-11-01 MED FILL — TRUE METRIX GLUCOSE TEST ST: 25 days supply | Qty: 100 | Fill #0

## 2019-11-01 MED FILL — PENTIPS 31G X 8 MM MISC: 31G X 8 MM | 30 days supply | Qty: 100 | Fill #0

## 2019-11-01 MED FILL — TRUE METRIX BLOOD GLUCOSE M: W/DEVICE | 1 days supply | Qty: 1 | Fill #0

## 2019-11-01 NOTE — Progress Notes (Signed)
All DC education was done with the assistance of Loistine Simas, [place] interpreter.  Instructed and had husband perform Left groin dressing change. Figured out schedule for dressing changes based on husbands work.  Instructed pt that she can shower and take out the dressing and then put in new dressing after shower.   Insulin pen administration reviewed with pt and pt has a daughter who is 64 who can assist her with seeing the numbers on the insulin pen since she is having some vision problems.  Pt had already been checking her BG with a meter but she has a new meter which came up from pharmacy.  Pt has meds from [facility] ( meter, lancets and strips) as well as 4 insulin pens.   Discussed use of incentive spirometer at home and pt demonstrated how to use it.  Discussed use of PO antibiotic (Augmentin) now vs. the IV antibiotics.   Food choices were discussed for a carb mod diet as well as s/s hypoglycemia.  Pt was very grateful for education and thanked the staff for their care.

## 2019-11-01 NOTE — Progress Notes (Signed)
Showed Morgan Williamson, PAC LLE is very swollen and pt states more swollen than has been .  Husband coming in so I can teach him about dressing changes.

## 2019-11-01 NOTE — Progress Notes (Signed)
Pharmacy Antibiotic Note  Morgan Williamson is a 43 y.o. female admitted on 10/25/2019 with necrotizing fascitis of left groin.  Also has LLL PNA. Continues on Zosyn (day 7) and Vancomycin (day 6).    S/p I + D 12/8 Scr stable  Tmax 99.0 Blood cultures negative, urine culture negative 12/8> groin wound: Strep anginosis S to penicillin, CTX, vanc and few Enterococcus S to amp and vanc   Surgery noted that since pateint does not want to have her gallbladder removed, plan to discharge on oral abx, discharge when Dr. Oretha Milch feels patient medically stable.    Plan: Continue Vancomycin 1250 mg iv Q 24 hours Continue Zosyn 3.375 grams iv Q 8 hours Will f/u Dr. Lisbeth Ply plan , noted she agrees with de-escalating to oral antibiotics.    Height: 4\' 9"  (144.8 cm) Weight: 180 lb (81.6 kg) IBW/kg (Calculated) : 38.6  Temp (24hrs), Avg:98.9 F (37.2 C), Min:98.7 F (37.1 C), Max:99 F (37.2 C)  Recent Labs  Lab 10/26/19 0454 10/26/19 0911 10/27/19 0148 10/28/19 0134 10/29/19 0404 10/30/19 0806 10/31/19 0016 11/01/19 0212  WBC  --   --  18.7* 14.3* 14.4* 15.7* 13.3* 13.0*  CREATININE  --   --  0.75 0.59 0.49 0.63 0.47  --   LATICACIDVEN 2.7* 2.0*  --   --   --   --   --   --     Estimated Creatinine Clearance: 81.5 mL/min (by C-G formula based on SCr of 0.47 mg/dL).    No Known Allergies   Antimicrobials this admission: Vancomycin 12/9> Zosyn 12/8>  Microbiology:  12/8> groin wound: Strep anginosis S to penicillin, CTX, vanc and few Enterococcus S to amp and vanc 12/8 BC x 2: Neg 12/11 UCx: Neg    Thank you Anette Guarneri, PharmD 11/01/2019 12:29 PM

## 2019-11-01 NOTE — Progress Notes (Signed)
6 Days Post-Op    CC: Groin pain/abdominal pain  Subjective: Patient sitting on the couch working outside this AM.  She is ambulating well without any additional assistance.  She says she is tolerating her diet, she is still having some intermittent nausea, bowel movements are normal.  She still has a cough but says it is better.  Her perineal wound looks about the same.  The dressing is not soaked with urine today. Translator [phone] was used to interview and examine the patient. Objective: Vital signs in last 24 hours: Temp:  [98.7 F (37.1 C)-99 F (37.2 C)] 99 F (37.2 C) (12/13 2155) Pulse Rate:  [84-93] 84 (12/13 2155) Resp:  [18] 18 (12/13 1500) BP: (102-121)/(65-76) 102/65 (12/13 2155) SpO2:  [89 %-92 %] 89 % (12/13 2155) Last BM Date: 10/31/19 520 p.o. Voided x1 Stool x1 Afebrile vital signs are stable WBC is still 13000 H/H is stable 06/21/2019 6.3 Intake/Output from previous day: 12/13 0701 - 12/14 0700 In: 520 [P.O.:520] Out: -  Intake/Output this shift: No intake/output data recorded.  General appearance: alert, cooperative and no distress Resp: clear to auscultation bilaterally GI: soft, non-tender; bowel sounds normal; no masses,  no organomegaly Skin: Perineal I&D site is clean but still showing some white exudate within the open site.  She still has some edema and erythema along the left thigh.  Lab Results:  Recent Labs    10/31/19 0016 11/01/19 0212  WBC 13.3* 13.0*  HGB 8.3* 8.3*  HCT 25.6* 26.3*  PLT 339 355    BMET Recent Labs    10/30/19 0806 10/31/19 0016  NA 135 136  K 3.4* 4.0  CL 101 103  CO2 26 24  GLUCOSE 102* 79  BUN 8 5*  CREATININE 0.63 0.47  CALCIUM 8.0* 8.0*   PT/INR No results for input(s): LABPROT, INR in the last 72 hours.  Recent Labs  Lab 10/26/19 0455 10/28/19 0134 10/29/19 0404  AST 53* 38 35  ALT 21 26 34  ALKPHOS 102 89 110  BILITOT 1.7* 0.4 0.6  PROT 6.9 5.6* 5.4*  ALBUMIN 3.0* 2.1* 1.9*     Lipase      Component Value Date/Time   LIPASE 17 10/26/2019 0455     Medications: . acetaminophen  650 mg Oral Q6H  . Chlorhexidine Gluconate Cloth  6 each Topical Once   And  . Chlorhexidine Gluconate Cloth  6 each Topical Once  . docusate sodium  100 mg Oral BID  . enoxaparin (LOVENOX) injection  40 mg Subcutaneous Q24H  . insulin aspart  0-9 Units Subcutaneous TID WC  . insulin aspart  5 Units Subcutaneous TID WC  . insulin detemir  24 Units Subcutaneous Daily  . multivitamin with minerals  1 tablet Oral Daily  . pantoprazole  40 mg Oral Q1200  . polyethylene glycol  17 g Oral Daily  . potassium chloride  20 mEq Oral BID PC  - wound culture Organism ID, Bacteria STREPTOCOCCUS ANGINOSIS   Organism ID, Bacteria ENTEROCOCCUS FAECALIS   Susceptibility   Streptococcus anginosis Enterococcus faecalis    MIC MIC    AMPICILLIN   <=2 SENSITIVE  Sensitive    CEFTRIAXONE 1 SENSITIVE  Sensitive      ERYTHROMYCIN 2 RESISTANT  Resistant      GENTAMICIN SYNERGY   RESISTANT  Resistant    LEVOFLOXACIN 1 SENSITIVE  Sensitive      PENICILLIN 0.12 SENSIT... Sensitive      VANCOMYCIN 0.5 SENSITIVE  Sensitive 1  SENSITIVE  Sensitive           Susceptibility Comments    Assessment/Plan Type 2 diabetes-untreated, A1c 11.8. Appreciate [facility] Left lower lobe pneumonia  -Vancomycin/Zosyn  Hx cholelithiasis, Biliary colic -pain, N and V with PO intact - pt would benefit from lap chole this admission, possibly tomorrow.  - CLD 2/2 N & V Sepsis-Necrotizing soft tissue infection left groin S/pSharp incision and debridement with excision of abscess cavity of the left perineum involving skin and subcutaneous tissue (5 x 3 x 2 cm)10/26/19, Dr. Corliss Skains - POD# 6   - Start BID wet to dry dressing changes. Ok to shower with wound open. WBC still elevated but trending down, will continue IV zosyn for today and plan to transition to augmentin at discharge for 1 week total of antibiotics  postoperatively. Anemia of unknown etiology - Hgb   9.5 on admit >>7.4>>7.2>>6.9 transfused 12/12>>8.3 (12/14) - monitor, Hypocalcemia - down to 7.6 today, monitor Hypokalemia -check magnesium and replace K+. Stress incontinence  - negative urine culture  [id number] modified ID: Vancomycin12/8>>,Zosyn 12/8>> DVT: SCD, lovenox Follow up: Medicine consult/Diabetes coordinator/Dr. Corliss Skains  Plan: Since the patient does not want her gallbladder removed I think we could plan to discharge her on oral antibiotics for her fasciitis.  Staff needs to teach her husband how to do the dressing change.  He comes in the evening so the p.m. staff will need to do this.  When Dr. Caleb Popp feels she is stable from medical standpoint, we can work on discharging her home.  They plan to go back to [place] next week.  She says they are now planning to stay for another month.      LOS: 5 days    Morgan Williamson 11/01/2019 Please see Amion

## 2019-11-01 NOTE — Progress Notes (Addendum)
[facility]  PROGRESS NOTE  Clotine Heiner ZOX:[id number] DOB: 1976-08-03 DOA: 10/25/2019 PCP: System, Pcp Not In Admit date - 10/25/2019   Admitting Physician Md Montez Morita, MD  Outpatient Primary MD for the patient is System, Pcp Not In  LOS - 5 Brief Narrative   Maleka Contino is a 43 y.o. year old female with medical history significant for diabetes on no home medications who presented on 10/25/2019 with RUQ abdominal pain with associated nausea/vomiting constipation x1 day and left groin boil of 1 week and high blood sugar and was found to have a necrotizing soft tissue infection left perineum.  Patient was empirically started on vancomycin and Zosyn and admitted by general surgery for urgent operative incision and debridement by Dr Corliss Skains on 12/8  [facility] was consulted on 12/8 to assist in management of her diabetes.   Hospital course complicated by left lower pneumonia    Subjective   Used IPAD Video Interpreter for conversation this am.  Mrs.  Trew today continues to report mild, dry cough, without fevers or chills.  Tolerating oral diet. A & P   Left lower lobe pna. Confirmed on CXR, no hypoxia, breathing comfortably -currently on broad spectrum antibiotics -Agree with de-escalating to oral antibiotics to continue for total of 7 days --encouraged incentive spirometry,  albuterol PRN  Type 2 diabetes, improved blood glucose over last 24 hours.  A1c 11.8%.  Fasting blood sugar this a.m. at goal -Continue Lantus 24 units, sliding scale as needed while in hospital -On discharge 70/30 18 units BID, insulin pen needles and blood glucose meter also ordered, -Stressed importance of weight loss,  -Case management consulted to assist with PCP follow-up and ensure affordability of insulin pen.  Microcytic anemia, secondary to iron deficiency anemia stable (confirmed on iron panel), likely combination of menorrhagia and blood loss from recent operation.  Hemoglobin on arrival 9.5.   Required 1 unit packed red blood cell for hemoglobin 6.9 on 12/12.  Hemoglobin now stable 8.3 without any bleeding episodes.   -Status post Feraheme x2 on 12/9 -Daily CBC, blood transfusion for goal hemoglobin greater than 7  Hypokalemia, resolved magnesium within normal limits.  Not having any current vomiting. -on daily potassium ( 20 meq BID),follow daily BMP  Sepsis secondary to necrotizing fasciitis of left perineum (multifactorial, Streptococcus anginosus, E faecalis).  Status post I&D on 12/8.  Remains afebrile, hemodynamically stable, sepsis physiology resolved.  White count is downtrending. -Continue vancomycin and Zosyn per surgical team -Blood cultures unremarkable -Antibiotics per primary -Intraoperative cultures showing E.faecalis and Strep anginosus both sensitive to vancomycin  Cholelithiasis with biliary colic. Improvement in nausea, no abdominal pain, tolerating oral diet -Closely monitor per primary  Hypocalcemia.  Measured calcium of 7.6, corrected calcium of 9.3 (albumin 1.9) -Continue to closely monitor  Stress Urine incontinence. Has episodes in setting of cough from pna mentioned above. Ua shows WBC 11-20, but no bacteria and patient denies dysuria. Additionally already on broad spectrum antibiotics -monitor   Family Communication  :  None at bedside  Code Status :  FULL  Disposition Plan  :  Medically stable for discharge pending affordability of insulin pen and PCP follow up for which case management was consulted.  Additionally will be important to take oral antibiotics to cover both her perennial infection and pneumonia (which she would need treatment for total of 7 days).  Thank you for this consultation.  Medical consultant will sign off   Lab Results  Component Value Date   PLT 355  11/01/2019    Diet :  Diet Order            Diet Carb Modified Fluid consistency: Thin; Room service appropriate? Yes  Diet effective now               Inpatient  Medications Scheduled Meds: . acetaminophen  650 mg Oral Q6H  . Chlorhexidine Gluconate Cloth  6 each Topical Once   And  . Chlorhexidine Gluconate Cloth  6 each Topical Once  . docusate sodium  100 mg Oral BID  . enoxaparin (LOVENOX) injection  40 mg Subcutaneous Q24H  . insulin aspart  0-9 Units Subcutaneous TID WC  . insulin aspart  5 Units Subcutaneous TID WC  . insulin detemir  24 Units Subcutaneous Daily  . multivitamin with minerals  1 tablet Oral Daily  . pantoprazole  40 mg Oral Q1200  . polyethylene glycol  17 g Oral Daily  . potassium chloride  20 mEq Oral BID PC   Continuous Infusions: . ferumoxytol 510 mg (10/27/19 1759)  . lactated ringers 10 mL/hr at 10/26/19 1030  . piperacillin-tazobactam (ZOSYN)  IV 3.375 g (11/01/19 0529)  . vancomycin 1,250 mg (10/31/19 1227)   PRN Meds:.albuterol, bisacodyl, diphenhydrAMINE **OR** diphenhydrAMINE, methocarbamol, morphine injection, ondansetron **OR** ondansetron (ZOFRAN) IV, oxyCODONE  Antibiotics  :   Anti-infectives (From admission, onward)   Start     Dose/Rate Route Frequency Ordered Stop   10/27/19 1400  clindamycin (CLEOCIN) IVPB 600 mg  Status:  Discontinued     600 mg 100 mL/hr over 30 Minutes Intravenous Every 8 hours 10/27/19 1250 10/27/19 1251   10/27/19 1300  vancomycin (VANCOCIN) 1,250 mg in sodium chloride 0.9 % 250 mL IVPB     1,250 mg 166.7 mL/hr over 90 Minutes Intravenous Every 24 hours 10/27/19 1251     10/27/19 0800  vancomycin (VANCOCIN) 1,250 mg in sodium chloride 0.9 % 250 mL IVPB  Status:  Discontinued     1,250 mg 166.7 mL/hr over 90 Minutes Intravenous Every 24 hours 10/26/19 1410 10/26/19 1552   10/26/19 1430  piperacillin-tazobactam (ZOSYN) IVPB 3.375 g  Status:  Discontinued     3.375 g 12.5 mL/hr over 240 Minutes Intravenous Every 8 hours 10/26/19 1345 10/26/19 1350   10/26/19 1430  piperacillin-tazobactam (ZOSYN) IVPB 3.375 g     3.375 g 12.5 mL/hr over 240 Minutes Intravenous Every 8 hours  10/26/19 1350     10/26/19 1415  piperacillin-tazobactam (ZOSYN) IVPB 3.375 g  Status:  Discontinued     3.375 g 100 mL/hr over 30 Minutes Intravenous Every 8 hours 10/26/19 1407 10/26/19 1410   10/26/19 1400  piperacillin-tazobactam (ZOSYN) IVPB 3.375 g  Status:  Discontinued     3.375 g 100 mL/hr over 30 Minutes Intravenous Every 8 hours 10/26/19 1336 10/26/19 1344   10/26/19 0600  piperacillin-tazobactam (ZOSYN) IVPB 3.375 g     3.375 g 100 mL/hr over 30 Minutes Intravenous  Once 10/26/19 0546 10/26/19 0654   10/26/19 0600  vancomycin (VANCOCIN) IVPB 1000 mg/200 mL premix     1,000 mg 200 mL/hr over 60 Minutes Intravenous  Once 10/26/19 0554 10/26/19 0702       Objective   Vitals:   10/31/19 0531 10/31/19 0532 10/31/19 1500 10/31/19 2155  BP: 96/61 100/61 121/76 102/65  Pulse: 79 78 93 84  Resp: 18  18   Temp: 98.8 F (37.1 C)  98.7 F (37.1 C) 99 F (37.2 C)  TempSrc: Oral  Oral Oral  SpO2:  93%  92% (!) 89%  Weight:      Height:        SpO2: (!) 89 % O2 Flow Rate (L/min): 1 L/min  Wt Readings from Last 3 Encounters:  10/26/19 81.6 kg     Intake/Output Summary (Last 24 hours) at 11/01/2019 1233 Last data filed at 11/01/2019 0730 Gross per 24 hour  Intake 520 ml  Output --  Net 520 ml    Physical Exam:   Lying in bed comfortably Awake Alert, Oriented X 3, Normal affect No new F.N deficits,  [place].AT, Moist oral mucosa Symmetrical Chest wall movement, Good air movement bilaterally on room air Normal rate,No Gallops,Rubs or new Murmurs,  +ve B.Sounds, Abd Soft, No tenderness, no guarding or rigidity. Decrease in nonblanching redness and tenderness of left thigh     I have personally reviewed the following:   Data Reviewed:  CBC Recent Labs  Lab 10/28/19 0134 10/29/19 0404 10/30/19 0806 10/31/19 0016 11/01/19 0212  WBC 14.3* 14.4* 15.7* 13.3* 13.0*  HGB 7.4* 7.2* 6.9* 8.3* 8.3*  HCT 24.6* 23.3* 21.9* 25.6* 26.3*  PLT 255 331 345 339 355   MCV 75.9* 74.2* 73.7* 74.6* 76.2*  MCH 22.8* 22.9* 23.2* 24.2* 24.1*  MCHC 30.1 30.9 31.5 32.4 31.6  RDW 17.7* 17.9* 18.1* 18.6* 18.7*    Chemistries  Recent Labs  Lab 10/26/19 0455 10/27/19 0148 10/28/19 0134 10/29/19 0404 10/30/19 0806 10/31/19 0016  NA  --  137 133* 135 135 136  K  --  3.7 3.4* 3.1* 3.4* 4.0  CL  --  104 102 102 101 103  CO2  --  21* 21* 25 26 24   GLUCOSE  --  253* 147* 110* 102* 79  BUN  --  17 17 10 8  5*  CREATININE  --  0.75 0.59 0.49 0.63 0.47  CALCIUM  --  7.6* 7.4* 7.6* 8.0* 8.0*  MG  --   --   --  2.1  --   --   AST 53*  --  38 35  --   --   ALT 21  --  26 34  --   --   ALKPHOS 102  --  89 110  --   --   BILITOT 1.7*  --  0.4 0.6  --   --    ------------------------------------------------------------------------------------------------------------------ No results for input(s): CHOL, HDL, LDLCALC, TRIG, CHOLHDL, LDLDIRECT in the last 72 hours.  Lab Results  Component Value Date   HGBA1C 11.8 (H) 10/26/2019   ------------------------------------------------------------------------------------------------------------------ No results for input(s): TSH, T4TOTAL, T3FREE, THYROIDAB in the last 72 hours.  Invalid input(s): FREET3 ------------------------------------------------------------------------------------------------------------------ No results for input(s): VITAMINB12, FOLATE, FERRITIN, TIBC, IRON, RETICCTPCT in the last 72 hours.  Coagulation profile No results for input(s): INR, PROTIME in the last 168 hours.  No results for input(s): DDIMER in the last 72 hours.  Cardiac Enzymes No results for input(s): CKMB, TROPONINI, MYOGLOBIN in the last 168 hours.  Invalid input(s): CK ------------------------------------------------------------------------------------------------------------------ No results found for: BNP  Micro Results Recent Results (from the past 240 hour(s))  Blood culture (routine x 2)     Status: None    Collection Time: 10/26/19  4:54 AM   Specimen: BLOOD  Result Value Ref Range Status   Specimen Description BLOOD LEFT ANTECUBITAL  Final   Special Requests   Final    BOTTLES DRAWN AEROBIC AND ANAEROBIC Blood Culture results may not be optimal due to an excessive volume of blood received in culture bottles  Culture   Final    NO GROWTH 5 DAYS Performed at [facility], [address] [place], [place] [address]    Report Status 10/31/2019 FINAL  Final  Blood culture (routine x 2)     Status: None   Collection Time: 10/26/19  4:54 AM   Specimen: BLOOD  Result Value Ref Range Status   Specimen Description BLOOD RIGHT ANTECUBITAL  Final   Special Requests   Final    BOTTLES DRAWN AEROBIC AND ANAEROBIC Blood Culture results may not be optimal due to an excessive volume of blood received in culture bottles   Culture   Final    NO GROWTH 5 DAYS Performed at [facility], [address], [place], [place] [address]    Report Status 10/31/2019 FINAL  Final  Respiratory Panel by RT PCR (Flu A&B, Covid) - Nasopharyngeal Swab     Status: None   Collection Time: 10/26/19  6:58 AM   Specimen: Nasopharyngeal Swab  Result Value Ref Range Status   SARS Coronavirus 2 by RT PCR NEGATIVE NEGATIVE Final    Comment: (NOTE) SARS-CoV-2 target nucleic acids are NOT DETECTED. The SARS-CoV-2 RNA is generally detectable in upper respiratoy specimens during the acute phase of infection. The lowest concentration of SARS-CoV-2 viral copies this assay can detect is 131 copies/mL. A negative result does not preclude SARS-Cov-2 infection and should not be used as the sole basis for treatment or other patient management decisions. A negative result may occur with  improper specimen collection/handling, submission of specimen other than nasopharyngeal swab, presence of viral mutation(s) within the areas targeted by this assay, and inadequate number of viral copies (<131 copies/mL). A negative result  must be combined with clinical observations, patient history, and epidemiological information. The expected result is Negative. Fact Sheet for Patients:  https://www.moore.com/ Fact Sheet for Healthcare Providers:  https://www.young.biz/ This test is not yet ap proved or cleared by the [place] FDA and  has been authorized for detection and/or diagnosis of SARS-CoV-2 by FDA under an Emergency Use Authorization (EUA). This EUA will remain  in effect (meaning this test can be used) for the duration of the COVID-19 declaration under Section 564(b)(1) of the Act, 21 U.S.C. section 360bbb-3(b)(1), unless the authorization is terminated or revoked sooner.    Influenza A by PCR NEGATIVE NEGATIVE Final   Influenza B by PCR NEGATIVE NEGATIVE Final    Comment: (NOTE) The Xpert Xpress SARS-CoV-2/FLU/RSV assay is intended as an aid in  the diagnosis of influenza from Nasopharyngeal swab specimens and  should not be used as a sole basis for treatment. Nasal washings and  aspirates are unacceptable for Xpert Xpress SARS-CoV-2/FLU/RSV  testing. Fact Sheet for Patients: https://www.moore.com/ Fact Sheet for Healthcare Providers: https://www.young.biz/ This test is not yet approved or cleared by the [place] FDA and  has been authorized for detection and/or diagnosis of SARS-CoV-2 by  FDA under an Emergency Use Authorization (EUA). This EUA will remain  in effect (meaning this test can be used) for the duration of the  Covid-19 declaration under Section 564(b)(1) of the Act, 21  U.S.C. section 360bbb-3(b)(1), unless the authorization is  terminated or revoked. Performed at [facility], [address] [place], [place] [address]   Aerobic/Anaerobic Culture (surgical/deep wound)     Status: None   Collection Time: 10/26/19 12:33 PM   Specimen: PATH Other; Tissue  Result Value Ref Range Status   Specimen  Description GROIN LEFT  Final  Special Requests NONE  Final   Gram Stain   Final    ABUNDANT WBC PRESENT, PREDOMINANTLY PMN ABUNDANT GRAM NEGATIVE RODS ABUNDANT GRAM POSITIVE COCCI    Culture   Final    MODERATE STREPTOCOCCUS ANGINOSIS FEW ENTEROCOCCUS FAECALIS NO ANAEROBES ISOLATED Performed at [facility], [address] [place], [place] [address]    Report Status 10/31/2019 FINAL  Final   Organism ID, Bacteria STREPTOCOCCUS ANGINOSIS  Final   Organism ID, Bacteria ENTEROCOCCUS FAECALIS  Final      Susceptibility   Enterococcus faecalis - MIC*    AMPICILLIN <=2 SENSITIVE Sensitive     VANCOMYCIN 1 SENSITIVE Sensitive     GENTAMICIN SYNERGY RESISTANT Resistant     * FEW ENTEROCOCCUS FAECALIS   Streptococcus anginosis - MIC*    PENICILLIN 0.12 SENSITIVE Sensitive     CEFTRIAXONE 1 SENSITIVE Sensitive     ERYTHROMYCIN 2 RESISTANT Resistant     LEVOFLOXACIN 1 SENSITIVE Sensitive     VANCOMYCIN 0.5 SENSITIVE Sensitive     * MODERATE STREPTOCOCCUS ANGINOSIS  Culture, Urine     Status: None   Collection Time: 10/29/19  8:50 AM   Specimen: Urine, Clean Catch  Result Value Ref Range Status   Specimen Description URINE, CLEAN CATCH  Final   Special Requests NONE  Final   Culture   Final    NO GROWTH Performed at [facility], [place] [address], [place], [place] [address]    Report Status 10/30/2019 FINAL  Final    Radiology Reports DG Chest 2 View  Result Date: 10/29/2019 CLINICAL DATA:  43 year old female old with history of cough. EXAM: CHEST - 2 VIEW COMPARISON:  No priors. FINDINGS: Left lower lobe airspace consolidation concerning for pneumonia. No pleural effusions. No evidence of pulmonary edema. Elevation of the right hemidiaphragm. Heart size is borderline enlarged. Upper mediastinal contours are within normal limits. IMPRESSION: 1. Left lower lobe pneumonia. Followup PA and lateral chest X-ray is recommended in 3-4 weeks following trial of antibiotic  therapy to ensure resolution and exclude underlying malignancy. Electronically Signed   By: Vinnie Langton M.D.   On: 10/29/2019 10:11   CT ABDOMEN PELVIS W CONTRAST  Result Date: 10/30/2019 CLINICAL DATA:  Anemia. Pt is 4 days post-op. Looking for possible source of bleeding that is causing anemia. Incision and drainage perirectal abscess on 10/26/2019. EXAM: CT ABDOMEN AND PELVIS WITH CONTRAST TECHNIQUE: Multidetector CT imaging of the abdomen and pelvis was performed using the standard protocol following bolus administration of intravenous contrast. CONTRAST:  159mL OMNIPAQUE IOHEXOL 300 MG/ML  SOLN COMPARISON:  10/26/2019 FINDINGS: Lower chest: Small right pleural effusion. Minor dependent lung base atelectasis. Heart normal in size. Hepatobiliary: Liver unremarkable. Large gallstone, unchanged. No evidence of acute cholecystitis. No bile duct dilation. Pancreas: Unremarkable. No pancreatic ductal dilatation or surrounding inflammatory changes. Spleen: Normal in size without focal abnormality. Adrenals/Urinary Tract: No adrenal masses. Kidneys normal size, orientation and position with symmetric enhancement and excretion. No masses, stones or hydronephrosis. Normal ureters. Normal bladder. Stomach/Bowel: Stomach is unremarkable. Small bowel and colon are normal in caliber. No wall thickening or inflammation. No evidence of a bowel mass. Normal appendix visualized. Vascular/Lymphatic: No significant vascular abnormality. No lymphadenopathy. Reproductive: Soft tissue air tracks along the left groin and inguinal canal, less extensive than on the prior CT. No defined fluid collection to suggest an abscess. Uterus and adnexa are unremarkable. Other: No evidence of retroperitoneal or intraperitoneal or pelvic hemorrhage. Musculoskeletal: No acute or  significant osseous findings. IMPRESSION: 1. No findings to account for anemia. No evidence of hemorrhage and no bowel mass or inflammation. 2. Soft tissue air  along the left inguinal region, partly imaged, with less air than noted on the prior CT. No fluid collection to suggest an abscess. 3. Gallstone without evidence of acute cholecystitis. 4. Small right pleural effusion new since the prior CT. Electronically Signed   By: Amie Portlandavid  Ormond M.D.   On: 10/30/2019 14:19   CT ABDOMEN PELVIS W CONTRAST  Result Date: 10/26/2019 CLINICAL DATA:  Acute generalized abdominal pain EXAM: CT ABDOMEN AND PELVIS WITH CONTRAST TECHNIQUE: Multidetector CT imaging of the abdomen and pelvis was performed using the standard protocol following bolus administration of intravenous contrast. CONTRAST:  100mL OMNIPAQUE IOHEXOL 300 MG/ML  SOLN COMPARISON:  None. FINDINGS: Lower chest:  No contributory findings. Hepatobiliary: No focal liver abnormality.Cholelithiasis and gallbladder sludge. No evidence of gallbladder inflammation. No bile duct dilatation. Pancreas: Unremarkable. Spleen: Unremarkable. Adrenals/Urinary Tract: Negative adrenals. No hydronephrosis or stone. Unremarkable bladder. Stomach/Bowel:  No obstruction. No appendicitis. Vascular/Lymphatic: No acute vascular abnormality. No mass or adenopathy. Reproductive:No pathologic findings. Other: No ascites or pneumoperitoneum. Soft tissue gas in the visualized left deep groin fat with tracking along the inguinal canal and into the mons pubis/labia, incompletely covered. No organized collection. Musculoskeletal: No acute abnormalities. These results were called by telephone at the time of interpretation on 10/26/2019 at 5:48 am to provider [facility] , who verbally acknowledged these results. IMPRESSION: 1. Partially covered soft tissue gas in the deep fat of the left groin and perineum compatible with necrotizing infection. No visualized abscess ; there is incomplete coverage. 2. Cholelithiasis without signs of cholecystitis. Electronically Signed   By: Marnee SpringJonathon  Watts M.D.   On: 10/26/2019 05:49     Time Spent in minutes   30     Laverna PeaceShayla D Kymoni Lesperance M.D on 11/01/2019 at 12:33 PM  To page go to [email or web address] - password [email or web address]

## 2019-11-01 NOTE — TOC Progression Note (Signed)
Transition of Care [facility]) - Progression Note    Patient Details  Name: Morgan Williamson MRN: [id number] Date of Birth: 08-25-1976  Transition of Care [facility]) CM/SW Contact  Jacalyn Lefevre Edson Snowball, RN Phone Number: 11/01/2019, 1:35 PM  Clinical Narrative:      Patient entered in [facility]  TOC to bring medications and supplies to bedside. Patient planning on staying in town for a month now. First available appointment at [facility] is December 01, 2019 at 0920 added to AVS.  Expected Discharge Plan: Home/Self Care Barriers to Discharge: Continued Medical Work up  Expected Discharge Plan and Services Expected Discharge Plan: Home/Self Care       Living arrangements for the past 2 months: Single Family Home                 DME Arranged: N/A         HH Arranged: NA           Social Determinants of Health (SDOH) Interventions    Readmission Risk Interventions No flowsheet data found.

## 2019-11-02 ENCOUNTER — Inpatient Hospital Stay (HOSPITAL_COMMUNITY): Payer: Self-pay

## 2019-11-02 DIAGNOSIS — R609 Edema, unspecified: Secondary | ICD-10-CM

## 2019-11-02 LAB — COMPREHENSIVE METABOLIC PANEL
ALT: 25 U/L (ref 0–44)
AST: 19 U/L (ref 15–41)
Albumin: 2.1 g/dL — ABNORMAL LOW (ref 3.5–5.0)
Alkaline Phosphatase: 115 U/L (ref 38–126)
Anion gap: 9 (ref 5–15)
BUN: <5 mg/dL — ABNORMAL LOW (ref 6–20)
CO2: 24 mmol/L (ref 22–32)
Calcium: 8.5 mg/dL — ABNORMAL LOW (ref 8.9–10.3)
Chloride: 102 mmol/L (ref 98–111)
Creatinine, Ser: 0.59 mg/dL (ref 0.44–1.00)
GFR calc Af Amer: >60 mL/min (ref >60)
GFR calc non Af Amer: >60 mL/min (ref >60)
Glucose, Bld: 169 mg/dL — ABNORMAL HIGH (ref 70–99)
Potassium: 4.1 mmol/L (ref 3.5–5.1)
Sodium: 135 mmol/L (ref 135–145)
Total Bilirubin: 0.5 mg/dL (ref 0.3–1.2)
Total Protein: 6.3 g/dL — ABNORMAL LOW (ref 6.5–8.1)

## 2019-11-02 LAB — CBC
HCT: 29.2 % — ABNORMAL LOW (ref 36.0–46.0)
Hemoglobin: 9 g/dL — ABNORMAL LOW (ref 12.0–15.0)
MCH: 23.9 pg — ABNORMAL LOW (ref 26.0–34.0)
MCHC: 30.8 g/dL (ref 30.0–36.0)
MCV: 77.5 fL — ABNORMAL LOW (ref 80.0–100.0)
Platelets: 394 10*3/uL (ref 150–400)
RBC: 3.77 MIL/uL — ABNORMAL LOW (ref 3.87–5.11)
RDW: 20.8 % — ABNORMAL HIGH (ref 11.5–15.5)
WBC: 13.6 10*3/uL — ABNORMAL HIGH (ref 4.0–10.5)
nRBC: 0.6 % — ABNORMAL HIGH (ref 0.0–0.2)

## 2019-11-02 LAB — GLUCOSE, CAPILLARY
Glucose-Capillary: 81 mg/dL (ref 70–99)
Glucose-Capillary: 158 mg/dL — ABNORMAL HIGH (ref 70–99)

## 2019-11-02 LAB — LIPASE, BLOOD: Lipase: 38 U/L (ref 11–51)

## 2019-11-02 MED ORDER — CENTRUM PO CHEW: 1.0000 | CHEWABLE_TABLET | Freq: Every day | ORAL | Status: AC

## 2019-11-02 MED ORDER — AMOXICILLIN-POT CLAVULANATE 875-125 MG PO TABS: 1.0000 | ORAL_TABLET | Freq: Two times a day (BID) | ORAL | 0 refills | Status: DC

## 2019-11-02 MED ORDER — AMOXICILLIN-POT CLAVULANATE 875-125 MG PO TABS: 1.0000 | ORAL_TABLET | Freq: Two times a day (BID) | ORAL | 0 refills | Status: AC

## 2019-11-02 MED ORDER — ACETAMINOPHEN 325 MG PO TABS: 650.0000 mg | ORAL_TABLET | Freq: Four times a day (QID) | ORAL | Status: AC

## 2019-11-02 MED FILL — AMOX-CLAV 875-125 MG TABLET: 875-125 | 7 days supply | Qty: 14 | Fill #0

## 2019-11-02 NOTE — Plan of Care (Signed)
  Problem: Clinical Measurements: Goal: Respiratory complications will improve 11/02/2019 0341 by Westly Pam, RN Outcome: Progressing 11/02/2019 0340 by Westly Pam, RN Outcome: Progressing   Problem: Activity: Goal: Risk for activity intolerance will decrease Outcome: Progressing   Problem: Nutrition: Goal: Adequate nutrition will be maintained Outcome: Progressing   Problem: Coping: Goal: Level of anxiety will decrease Outcome: Progressing   Problem: Pain Managment: Goal: General experience of comfort will improve Outcome: Progressing

## 2019-11-02 NOTE — Discharge Summary (Signed)
Physician Discharge Summary  Patient ID: [place] MRN: [id number] DOB/AGE: 07/01/1976 43 y.o.  Admit date: 10/25/2019 Discharge date: 11/02/2019  Admission Diagnoses:  Sepsis with necrotizing soft tissue infection left groin Type 2 diabetes untreated hemoglobin A1c  11.8 Anemia Stress incontinence Cholelithiasis  Discharge Diagnoses:  Sepsis with necrotizing soft tissue infection left groin Type 2 diabetes untreated with hemoglobin A1c of 11.8 Cholelithiasis with biliary colic Type 2 diabetes Anemia Stress incontinence Hypocalcemia   Active Problems:   Necrotizing fasciitis ([place])   Type 2 diabetes mellitus without complication (HCC)   Left lower lobe pneumonia   Microcytic anemia   Iron deficiency anemia due to chronic blood loss   Sepsis (HCC)   Cholelithiasis   Biliary colic   Hyperglycemia due to diabetes mellitus ([place])   PROCEDURE: Sharp incision and debridement with excision of abscess cavity of the left perineum involving skin and subcutaneous tissue(5 x 3 x 2 cm) 10/26/2019 Dr. Rodman [facility] Course:  43F p/w RUQ abdominal pain x1 day and L groin "boil" x1 week. She reports associated n/v and constipation, but no fevers at home. She is actively vomiting at the time of my exam. She carries a diagnosis of diabetes, but reports not taking her medications which she describes as pills only, no insulin. History is obtained using a spanish interpreter.   Patient was seen in the emergency department admitted by Dr. Reather Laurence.  She was found to have necrotizing fasciitis in the left perineum.  Work-up was negative for any other acute intra-abdominal process.  She was taken the operating room and underwent incision and debridement of her left perineal space.  She had a large gallstone but no evidence of acute cholecystitis.  She had intermittent nausea and vomiting consistent with cholelithiasis and biliary colic.  After her I&D she continued to have some  intermittent nausea and abdominal discomfort.  We offered her cholecystectomy but she is opted not to have that done at this time.  They live in [place] but are here temporarily for her husband's work.  We will schedule patient for cholecystectomy on 10/29/2019 and she opted out.  But she did complain of ongoing progressive cough, and some urinary incontinence.  Urinalysis was normal but chest x-ray was suggestive of a left lower lobe pneumonia.  She had been placed on IV antibiotics for necrotizing fasciitis which included both vancomycin and Zosyn.  These were continued for 7 days.  She is continued to show good progress and was subsequently converted over to oral Augmentin.  We offered her again undergoing cholecystectomy before she was discharged because of some recurrent nausea on 11/02/2019 after eating breakfast.  She still declines elective cholecystectomy at this time.  We taught the patient were teaching her husband how to do the dressing changes.  We have used the interpreter for each conversation the conversation I just completed was with interpreter [phone].  At this point we went over the discharge instructions with her and her husband in detail. Currently she is irrigating the open site with water using a Toomey-like syringe.  Then washing with soap and water in the shower and then redressing wet-to-dry.  She is instructed to do this twice a day at home.  She is going to be given 7 more days of Augmentin on discharge.  Medicine consult was obtained and they have been helping manage her diabetes.  Also replaced her potassium.  She is undergone training from the nursing staff dietitian and diabetes coordinator on the care  of her diabetes.  Case Managers have arranged follow-up appointment here in [place].  If she goes back to [place] she will have to find follow-up on her own.  They do reside permanently in [place] and it is unclear when they are going back.  She complained of some left  lower extremity swelling we obtain venous Dopplers which were negative.  Discharge medicines are listed below.  CBC Latest Ref Rng & Units 11/02/2019 11/01/2019 10/31/2019  WBC 4.0 - 10.5 K/uL 13.6(H) 13.0(H) 13.3(H)  Hemoglobin 12.0 - 15.0 g/dL 9.0(L) 8.3(L) 8.3(L)  Hematocrit 36.0 - 46.0 % 29.2(L) 26.3(L) 25.6(L)  Platelets 150 - 400 K/uL 394 355 339   CMP Latest Ref Rng & Units 11/02/2019 10/31/2019 10/30/2019  Glucose 70 - 99 mg/dL 169(H) 79 102(H)  BUN 6 - 20 mg/dL <5(L) 5(L) 8  Creatinine 0.44 - 1.00 mg/dL 0.59 0.47 0.63  Sodium 135 - 145 mmol/L 135 136 135  Potassium 3.5 - 5.1 mmol/L 4.1 4.0 3.4(L)  Chloride 98 - 111 mmol/L 102 103 101  CO2 22 - 32 mmol/L _0 Calcium 8.9 - 10.3 mg/dL 8.5(L) 8.0(L) 8.0(L)  Total Protein 6.5 - 8.1 g/dL 6.3(L) - -  Total Bilirubin 0.3 - 1.2 mg/dL 0.5 - -  Alkaline Phos 38 - 126 U/L 115 - -  AST 15 - 41 U/L 19 - -  ALT 0 - 44 U/L 25 - -    Disposition: Condition on discharge: Improved   Allergies as of 11/02/2019   No Known Allergies     Medication List    TAKE these medications   acetaminophen 325 MG tablet Commonly known as: TYLENOL Take 2 tablets (650 mg total) by mouth every 6 (six) hours.   amoxicillin-clavulanate 875-125 MG tablet Commonly known as: AUGMENTIN Take 1 tablet by mouth 2 (two) times daily with a meal.   blood glucose meter kit and supplies Dispense based on patient and insurance preference. Use up to four times daily as directed. (FOR ICD-10 E10.9, E11.9).   Insulin Pen Needle 31G X 5 MM Misc Use as directed for insulin pen   multivitamin-iron-minerals-folic acid chewable tablet Chew 1 tablet by mouth daily.   NovoLIN 70/30 FlexPen Relion (70-30) 100 UNIT/ML PEN Generic drug: Insulin Isophane & Regular Human Inject 18 Units into the skin 2 (two) times daily.      Follow-up [place]. Go to.   Specialty: Internal Medicine Why: Januaury 13, 2021 at 0920 am   Contact information: [place] [phone] [place] [phone]       Surgery, [place] Follow up on 11/15/2019.   Specialty: General Surgery Why: your appointment is at 2:30 PM.  Be at the office 30 minutes early for check in.  Bring photo ID and insurance information.   Contact information: [address] [place] [address] [phone]           Signed: Earnstine Regal 11/02/2019, 2:25 PM

## 2019-11-02 NOTE — Progress Notes (Addendum)
7 Days Post-Op    CC: Groin pain/abdominal pain  Subjective: Patient is up and walking comfortably.  She says her cough is the same but her lungs are clear to auscultation.  She has some edema in her left lower extremity and Doppler studies are pending.  A picture of the perineal wound with description as below.  Objective: Vital signs in last 24 hours: Temp:  [98.4 F (36.9 C)-98.5 F (36.9 C)] 98.5 F (36.9 C) (12/15 0526) Pulse Rate:  [79-91] 91 (12/15 0526) Resp:  [18] 18 (12/15 0526) BP: (90-142)/(60-80) 120/77 (12/15 0526) SpO2:  [94 %-98 %] 94 % (12/15 0526) Last BM Date: 11/01/19 720 p.o. Voided x 6 No other intake recorded Afebrile vital signs are stable. WBC still elevated 13.6 H/H improving CBC Latest Ref Rng & Units 11/02/2019 11/01/2019 10/31/2019  WBC 4.0 - 10.5 K/uL 13.6(H) 13.0(H) 13.3(H)  Hemoglobin 12.0 - 15.0 g/dL 9.0(L) 8.3(L) 8.3(L)  Hematocrit 36.0 - 46.0 % 29.2(L) 26.3(L) 25.6(L)  Platelets 150 - 400 K/uL 394 355 339  Glucoses ranged from 144-81.  Intake/Output from previous day: 12/14 0701 - 12/15 0700 In: 720 [P.O.:720] Out: -  Intake/Output this shift: No intake/output data recorded.  General appearance: alert, cooperative and no distress Resp: clear to auscultation bilaterally GI: soft, non-tender; bowel sounds normal; no masses,  no organomegaly and Tolerating diet well no complaints of nausea or vomiting. Extremities: Some edema in the left lower extremity.  The erythema in her left thigh is improving. Skin: Perineal wound is pictured below.  The perineal wound is clean there is still some white from fibrotic necrosis at the very base.  But overall is clean.  It tracks slightly caudally about 2 cm, and is being packed.  Lab Results:  Recent Labs    11/01/19 0212 11/02/19 0418  WBC 13.0* 13.6*  HGB 8.3* 9.0*  HCT 26.3* 29.2*  PLT 355 394    BMET Recent Labs    10/30/19 0806 10/31/19 0016  NA 135 136  K 3.4* 4.0  CL 101 103    CO2 26 24  GLUCOSE 102* 79  BUN 8 5*  CREATININE 0.63 0.47  CALCIUM 8.0* 8.0*   PT/INR No results for input(s): LABPROT, INR in the last 72 hours.  Recent Labs  Lab 10/28/19 0134 10/29/19 0404  AST 38 35  ALT 26 34  ALKPHOS 89 110  BILITOT 0.4 0.6  PROT 5.6* 5.4*  ALBUMIN 2.1* 1.9*     Lipase     Component Value Date/Time   LIPASE 17 10/26/2019 0455     Medications: . acetaminophen  650 mg Oral Q6H  . amoxicillin-clavulanate  1 tablet Oral BID WC  . Chlorhexidine Gluconate Cloth  6 each Topical Once   And  . Chlorhexidine Gluconate Cloth  6 each Topical Once  . docusate sodium  100 mg Oral BID  . enoxaparin (LOVENOX) injection  40 mg Subcutaneous Q24H  . insulin aspart  0-9 Units Subcutaneous TID WC  . insulin aspart  5 Units Subcutaneous TID WC  . insulin detemir  24 Units Subcutaneous Daily  . multivitamin with minerals  1 tablet Oral Daily  . pantoprazole  40 mg Oral Q1200  . polyethylene glycol  17 g Oral Daily  . potassium chloride  20 mEq Oral BID PC    Assessment/Plan Type 2 diabetes-untreated, A1c 11.8. Appreciate [facility] Left lower lobe pneumonia  -Vancomycin/Zosyn LLE swelling - venous dopplers are negative  Hx cholelithiasis, Biliary colic -pain, N and  V with PO intact - pt would benefit from lap chole this admission,pt has refused - Carb Mod diet Sepsis-Necrotizing soft tissue infection left groin S/pSharp incision and debridement with excision of abscess cavity of the left perineum involving skin and subcutaneous tissue (5 x 3 x 2 cm)10/26/19, Dr. Corliss Skains - POD# 7 - Start BID wet to dry dressing changes. Ok to shower with wound open. WBC still elevated but trending down, will continue IV zosyn for today and plan to transition to augmentin at discharge for 1 week total of antibiotics postoperatively.Pt and husband taught to irrigate open site with water, shower, allow soap and water into the site; then repack BID  Anemia of  unknown etiology - Hgb   9.5 on admit >>7.4>>7.2>>6.9 transfused 12/12>>8.3 (12/14) - monitor, Hypocalcemia - down to 7.6today, monitor Hypokalemia-check magnesium and replace K+. Stress incontinence  - negative urine culture  VOH:YWVP modified ID: Vancomycin/Zosyn 12/8-12/14; Augmentin 12/14>> day 2 DVT: SCD, lovenox Follow up: Medicine consult/Diabetes coordinator/[facility]  Plan: She does not have a hand-held shower at home so we are going to get her a Toomey syringe so that she can irrigate this area.  She will then be able to take a shower get soap and water inside the I&D site and then redress.  We are going to have her husband do the dressing change again.  He did it last night and apparently got loose but were going to have him do it again tonight before she goes home.  I am aiming to send her home later today.  She has had 7 days of Zosyn and vancomycin.  Her WBC is still elevated; we originally planned 7 days of antibiotics for her fasciitis and pneumonia.  We will review this with Dr. Dwain Sarna.  We are also getting Dopplers studies of the left leg just to make sure she does not have a DVT.  She has been on DVT prophylaxis with Lovenox since 10/27/2019.  Venous doppler negative, now complaining of nausea.Will recheck CMP/lipase.   LOS: 6 days    Proctor Carriker 11/02/2019 Please see Amion

## 2019-11-02 NOTE — Progress Notes (Signed)
Left lower extremity venous duplex has been completed. Preliminary results can be found in CV Proc through chart review.  Results were given to the patient's nurse, Mendel Ryder.  11/02/19 9:06 AM [place] RVT

## 2019-11-02 NOTE — Progress Notes (Signed)
Nutrition Follow-up  DOCUMENTATION CODES:   Obesity unspecified  INTERVENTION:   -Continue MVI with minerals daily  NUTRITION DIAGNOSIS:   Increased nutrient needs related to post-op healing as evidenced by estimated needs.  Ongoing  GOAL:   Patient will meet greater than or equal to 90% of their needs  Progressing   MONITOR:   PO intake, Supplement acceptance, Diet advancement, Labs, Weight trends, Skin, I & O's  REASON FOR ASSESSMENT:   Consult Diet education  ASSESSMENT:   51F p/w RUQ abdominal pain x1 day and L groin "boil" x1 week. She reports associated n/v and constipation, but no fevers at home. She is actively vomiting at the time of my exam. She carries a diagnosis of diabetes, but reports not taking her medications which she describes as pills only, no insulin. History is obtained using a spanish interpreter.  12/8- s/p Procedure performed: Sharp incision and debridement with excision of abscess cavity of the left perineum involving skin and subcutaneous tissue (5 x 3 x 2 cm)  Reviewed I/O's: +720 ml x 24 hours and +9.2 L since admission  Pt receiving nursing care at time of visit.   Pt with good appetite. Noted meal completion 75-100%. Pt meeting nutritional needs with MVI and carb modified diet.   Per general surgery notes, pt declined lap chole. Plan to discharge home today with PO antibiotics. Pt husband will receive further education on dressing changes.   Medications reviewed and include miralax.   Labs reviewed: CBGS: 81-150 (inpatient orders for glycemic control are 0-9 units insulin aspart TID with meals, 5 units insulin aspart TID with meals, and 24 units insulin detemir every 24 hours).   Diet Order:   Diet Order            Diet Carb Modified Fluid consistency: Thin; Room service appropriate? Yes  Diet effective now              EDUCATION NEEDS:   No education needs have been identified at this time  Skin:  Skin Assessment: Skin  Integrity Issues: Skin Integrity Issues:: Incisions Incisions: closed lt groin  Last BM:  11/01/19  Height:   Ht Readings from Last 1 Encounters:  10/26/19 4\' 9"  (1.448 m)    Weight:   Wt Readings from Last 1 Encounters:  10/26/19 81.6 kg    Ideal Body Weight:  43.2 kg  BMI:  Body mass index is 38.95 kg/m.  Estimated Nutritional Needs:   Kcal:  1500-1700  Protein:  70-85 grams  Fluid:  > 1.5 L    Leni Pankonin A. Jimmye [place], RD, LDN, [place] Registered Dietitian II Certified Diabetes Care and Education Specialist Pager: [phone] After hours Pager: [phone]

## 2019-11-12 ENCOUNTER — Emergency Department (HOSPITAL_COMMUNITY)
Admission: EM | Admit: 2019-11-12 | Discharge: 2019-11-12 | Disposition: A | Discharge status: Home / Self Care | Payer: Self-pay | Attending: Emergency Medicine | Admitting: Emergency Medicine

## 2019-11-12 ENCOUNTER — Emergency Department (HOSPITAL_COMMUNITY): Payer: Self-pay

## 2019-11-12 ENCOUNTER — Other Ambulatory Visit: Payer: Self-pay

## 2019-11-12 ENCOUNTER — Encounter (HOSPITAL_COMMUNITY): Payer: Self-pay

## 2019-11-12 DIAGNOSIS — Z794 Long term (current) use of insulin: Secondary | ICD-10-CM | POA: Insufficient documentation

## 2019-11-12 DIAGNOSIS — L7622 Postprocedural hemorrhage and hematoma of skin and subcutaneous tissue following other procedure: Secondary | ICD-10-CM | POA: Insufficient documentation

## 2019-11-12 DIAGNOSIS — T148XXA Other injury of unspecified body region, initial encounter: Secondary | ICD-10-CM

## 2019-11-12 DIAGNOSIS — E119 Type 2 diabetes mellitus without complications: Secondary | ICD-10-CM | POA: Insufficient documentation

## 2019-11-12 LAB — I-STAT CHEM 8, ED
BUN: 17 mg/dL (ref 6–20)
Calcium, Ion: 1.18 mmol/L (ref 1.15–1.40)
Chloride: 105 mmol/L (ref 98–111)
Creatinine, Ser: 0.8 mg/dL (ref 0.44–1.00)
Glucose, Bld: 211 mg/dL — ABNORMAL HIGH (ref 70–99)
HCT: 33 % — ABNORMAL LOW (ref 36.0–46.0)
Hemoglobin: 11.2 g/dL — ABNORMAL LOW (ref 12.0–15.0)
Potassium: 4.3 mmol/L (ref 3.5–5.1)
Sodium: 142 mmol/L (ref 135–145)
TCO2: 27 mmol/L (ref 22–32)

## 2019-11-12 LAB — CBC WITH DIFFERENTIAL/PLATELET
Abs Immature Granulocytes: 0.01 10*3/uL (ref 0.00–0.07)
Basophils Absolute: 0.1 10*3/uL (ref 0.0–0.1)
Basophils Relative: 1 %
Eosinophils Absolute: 0 10*3/uL (ref 0.0–0.5)
Eosinophils Relative: 1 %
HCT: 35.4 % — ABNORMAL LOW (ref 36.0–46.0)
Hemoglobin: 10.4 g/dL — ABNORMAL LOW (ref 12.0–15.0)
Immature Granulocytes: 0 %
Lymphocytes Relative: 29 %
Lymphs Abs: 1.6 10*3/uL (ref 0.7–4.0)
MCH: 24.5 pg — ABNORMAL LOW (ref 26.0–34.0)
MCHC: 29.4 g/dL — ABNORMAL LOW (ref 30.0–36.0)
MCV: 83.3 fL (ref 80.0–100.0)
Monocytes Absolute: 0.6 10*3/uL (ref 0.1–1.0)
Monocytes Relative: 10 %
Neutro Abs: 3.3 10*3/uL (ref 1.7–7.7)
Neutrophils Relative %: 59 %
Platelets: 492 10*3/uL — ABNORMAL HIGH (ref 150–400)
RBC: 4.25 MIL/uL (ref 3.87–5.11)
RDW: 22.8 % — ABNORMAL HIGH (ref 11.5–15.5)
WBC: 5.5 10*3/uL (ref 4.0–10.5)
nRBC: 0 % (ref 0.0–0.2)

## 2019-11-12 LAB — COMPREHENSIVE METABOLIC PANEL
ALT: 14 U/L (ref 0–44)
AST: 12 U/L — ABNORMAL LOW (ref 15–41)
Albumin: 2.9 g/dL — ABNORMAL LOW (ref 3.5–5.0)
Alkaline Phosphatase: 86 U/L (ref 38–126)
Anion gap: 8 (ref 5–15)
BUN: 18 mg/dL (ref 6–20)
CO2: 26 mmol/L (ref 22–32)
Calcium: 8.4 mg/dL — ABNORMAL LOW (ref 8.9–10.3)
Chloride: 107 mmol/L (ref 98–111)
Creatinine, Ser: 0.77 mg/dL (ref 0.44–1.00)
GFR calc Af Amer: >60 mL/min (ref >60)
GFR calc non Af Amer: >60 mL/min (ref >60)
Glucose, Bld: 214 mg/dL — ABNORMAL HIGH (ref 70–99)
Potassium: 4.3 mmol/L (ref 3.5–5.1)
Sodium: 141 mmol/L (ref 135–145)
Total Bilirubin: 0.4 mg/dL (ref 0.3–1.2)
Total Protein: 6.9 g/dL (ref 6.5–8.1)

## 2019-11-12 LAB — LACTIC ACID, PLASMA: Lactic Acid, Venous: 1.7 mmol/L (ref 0.5–1.9)

## 2019-11-12 MED ORDER — IOHEXOL 300 MG/ML  SOLN
100.0000 mL | Freq: Once | INTRAMUSCULAR | Status: AC | PRN
  Administered 2019-11-12: 100 mL via INTRAVENOUS

## 2019-11-12 MED ORDER — SODIUM CHLORIDE (PF) 0.9 % IJ SOLN
INTRAMUSCULAR | Status: AC
  Filled 2019-11-12: qty 50

## 2019-11-12 MED ORDER — LIDOCAINE-EPINEPHRINE (PF) 2 %-1:200000 IJ SOLN
INTRAMUSCULAR | Status: AC
  Filled 2019-11-12: qty 20

## 2019-11-12 MED ORDER — INSULIN ASPART 100 UNIT/ML ~~LOC~~ SOLN
5.0000 [IU] | Freq: Once | SUBCUTANEOUS | Status: AC
  Administered 2019-11-12: 5 [IU] via SUBCUTANEOUS
  Filled 2019-11-12: qty 0.05

## 2019-11-12 MED ORDER — SULFAMETHOXAZOLE-TRIMETHOPRIM 800-160 MG PO TABS
1.0000 | ORAL_TABLET | Freq: Once | ORAL | Status: AC
  Administered 2019-11-12: 1 via ORAL
  Filled 2019-11-12: qty 1

## 2019-11-12 MED ORDER — SODIUM CHLORIDE 0.9 % IV BOLUS
1000.0000 mL | Freq: Once | INTRAVENOUS | Status: AC
  Administered 2019-11-12: 1000 mL via INTRAVENOUS

## 2019-11-12 MED ORDER — SULFAMETHOXAZOLE-TRIMETHOPRIM 800-160 MG PO TABS: 1.0000 | ORAL_TABLET | Freq: Two times a day (BID) | ORAL | 0 refills | Status: AC

## 2019-11-12 NOTE — Consult Note (Signed)
Re:   [place] DOB:   [birth year] MRN:   [id number]  Chief Complaint Abscess draining  ASSESEMENT AND PLAN: 1. Bleeding vessel at base of wound  I over sewed the visible vessel  2.  Left buttocks/thigh abscess  Prior drainage on 10/26/2019  The CT scan suggest residual abscess, but the thigh clinically is unremarkable.  I discussed with Dr. Darl Householder about giving her 1 more week of antibiotics.  She has a follow up in our office on 12/28 that she will keep  3.  DM 4.  History of anemia 5.  Cholelithiasis 6.  SARS Coronavirus 2 - neg - 10/26/2019  Chief Complaint  Patient presents with  . Wound Check   PHYSICIAN REQUESTING CONSULTATION:  Dr. Shirlyn Goltz, [facility] ER  HISTORY OF PRESENT ILLNESS: [place] is a 43 y.o. (DOB: Apr 19, 1976)  Hispanic female whose primary care physician is System, Pcp Not In.  She speaks limited [place].  The nurse who helped me speaks some Spanish and we used the translator screen.  She was brought by ambulance to the [facility] ER for bleeding from a left perineal/upper thigh wound.  She is running no fever, her BS are okay, though she has been poorly controlled.  She finished a course of antibiotics on 11/09/2019.  She had a I&D of her left perineum by Dr. Georgette Dover on 10/26/2019.  She was discharged on 11/02/2019 to home.  Dr. Darl Householder obtained a CT scan of the pelvis/thigh.  CT scan - 11/12/2019 - 1. 3 x 7 x 8 cm disklike rim enhancing collection of fluid and debris identified in the proximal aspect of the medial left thigh, compatible with abscess. This is better organized than it was on the 10/26/2019 exam but the region was incompletely visualized on the 10/30/19 exam. The cranial portion of this abscess further dissects anteriorly up into the left groin region, up to about the level of the left pubic bone. This is similar to 10/30/2019.  2. Cholelithiasis.  WBC - 5,500 - 11/12/2019 Glucose - 214 - 11/12/2019    Past Medical History:  Diagnosis Date  . Cough  10/2019  . Diabetes mellitus without complication ([place])   . Gallstone 10/2019      Past Surgical History:  Procedure Laterality Date  . INCISION AND DRAINAGE PERIRECTAL ABSCESS Left 10/26/2019   Procedure: INCISION AND DRAINAGE GROIN;  Surgeon: Donnie Mesa, MD;  Location: [facility];  Service: General;  Laterality: Left;      Current Facility-Administered Medications  Medication Dose Route Frequency Provider Last Rate Last Admin  . sodium chloride (PF) 0.9 % injection           . sodium chloride (PF) 0.9 % injection            Current Outpatient Medications  Medication Sig Dispense Refill  . acetaminophen (TYLENOL) 325 MG tablet Take 2 tablets (650 mg total) by mouth every 6 (six) hours. (Patient taking differently: Take 650 mg by mouth every 6 (six) hours as needed for mild pain, moderate pain, fever or headache. )    . blood glucose meter kit and supplies Dispense based on patient and insurance preference. Use up to four times daily as directed. (FOR ICD-10 E10.9, E11.9). 1 each 0  . Insulin Isophane & Regular Human (NOVOLIN 70/30 FLEXPEN RELION) (70-30) 100 UNIT/ML PEN Inject 18 Units into the skin 2 (two) times daily. 15 mL 11  . Insulin Pen Needle 31G X 5 MM MISC Use as directed for insulin  pen 1000 each 0  . multivitamin-iron-minerals-folic acid (CENTRUM) chewable tablet Chew 1 tablet by mouth daily.    Marland Kitchen amoxicillin-clavulanate (AUGMENTIN) 875-125 MG tablet Take 1 tablet by mouth 2 (two) times daily with a meal. (Patient not taking: Reported on 11/12/2019) 14 tablet 0     No Known Allergies  REVIEW OF SYSTEMS: Skin:  Had left perineal/thigh infection Neurologic:  No history of stroke.  No history of seizure.  No history of headaches. Cardiac:  No history of hypertension. No history of heart disease.   Pulmonary:  Does not smoke cigarettes.    Endocrine:  DM - HgbA1c - 11.8 on 12//06/2019.  She has been diabetic for years. Gastrointestinal:  Has asymptomatic  cholelithiasis Urologic:  No history of bladder infections. Musculoskeletal:  No history of joint or back disease. Hematologic:  No bleeding disorder.   Psycho-social:  The patient is oriented.     SOCIAL and FAMILY HISTORY: Married. Limited English  PHYSICAL EXAM: BP 121/84   Pulse 91   Temp 98 F (36.7 C)   Resp 16   LMP 10/16/2019 Comment: pregnancy waiver form signed 11-12-2019  SpO2 96%   General: WN WF who is alert and generally healthy appearing.  Skin:  Inspection and palpation - no mass or rash. Eyes:  Conjunctiva and lids unremarkable.            Pupils are equal Ears, Nose, Mouth, and Throat:  Ears and nose unremarkable            Lips and teeth are unremarable. Neck: Supple. No mass, trachea midline.  No thyroid mass. Lungs: Normal respiratory effort.  Clear to auscultation and symmetric breath sounds. Heart:  Palpation of the heart is normal.            Auscultation: RRR. No murmur or rub.  Abdomen: Soft. No mass. No tenderness. No hernia.             Normal bowel sounds.   Rectal/buttocks: 4 cm wound in left perineum/upper thigh.   She has a pumping vessel which is visible. Musculoskeletal:  Good muscle strength and ROM  in upper and lower extremities.  In her upper inner thigh, she has a firm area that is not that impressive.  Neurologic:  Grossly intact to motor and sensory function. Psychiatric: Normal judgement and insight.   DATA REVIEWED, COUNSELING AND COORDINATION OF CARE: Epic notes reviewed. Counseling and coordination of care exceeded more than 50% of the time spent with patient. Total time spent with patient and charting: 50 minutes.  Alphonsa Overall, MD,  [facility], [address]  [place], [place]    [place] Phone:  [phone] FAX:  [phone]

## 2019-11-12 NOTE — ED Triage Notes (Signed)
EMS reports from home, called out for ongoing wound bleeding at abscess to thigh I&D 10 days ago. Finished ABX Tuesday. EMS states Pt dizzy on standing and better while sitting. Pt Hx of diabetes.  BP 118/72 HR 82 RR 18 Sp02 98 RA CBG 290  20ga LAC 453ml NS enroute.

## 2019-11-12 NOTE — Op Note (Signed)
*   No surgery found *  7:29 PM  PATIENT:  [place], 43 y.o., female, MRN: [id number]  PREOP DIAGNOSIS:  Bleeding from left perineal wound  POSTOP DIAGNOSIS:   Bleeding from left perineal wound, single vessel identified.  PROCEDURE:   Over sew vessel with 3-0 vicryl.  SURGEON:   Alphonsa Overall, M.D.  ANESTHESIA:  None  EBL:  20  ml   INDICATIONS FOR PROCEDURE:  [place] is a 43 y.o. (DOB: Feb 12, 1976) Hispanic female whose primary care physician is System, Pcp Not In.  She came to the [facility] ER bleeding from her wound that was created to drain an abscess.  PROCEDURE: I could see a single pumping vessel in the posterior wound.  I cleaned the wound with Betadine.  I placed a single figure 8 suture of 3-0 vicryl over the vessel.  This controlled the bleeding.  I then packed the wound.    She tolerated this well.   I got the Spanish interpreter screen to go over instructions.  Alphonsa Overall, MD, [facility] Office phone:  [phone]

## 2019-11-12 NOTE — ED Provider Notes (Signed)
[facility] DEPT Provider Note   CSN: [id number] Arrival date & time: 11/12/19  1300     History Chief Complaint  Patient presents with  . Wound Check    Nocole Zammit is a 43 y.o. female history diabetes here presenting with draining wound in her buttock. Patient was recently admitted for necrotizing fasciitis and had emergent surgery and had a wound in the left buttock area . Husband has been changing her dressing and she just finished a course of antibiotic 3 days. Today the husband changed her dressing and noticed some increase bloody drainage from the wound. Denies any purulent drainage. Denies any fevers.   The history is provided by the patient.       Past Medical History:  Diagnosis Date  . Cough 10/2019  . Diabetes mellitus without complication ([place])   . Gallstone 10/2019    Patient Active Problem List   Diagnosis Date Noted  . Left lower lobe pneumonia 10/29/2019  . Microcytic anemia 10/29/2019  . Iron deficiency anemia due to chronic blood loss 10/29/2019  . Sepsis ([place]) 10/29/2019  . Cholelithiasis 10/29/2019  . Biliary colic 96/75/9163  . Hyperglycemia due to diabetes mellitus ([place]) 10/29/2019  . Necrotizing fasciitis ([place]) 10/26/2019  . Type 2 diabetes mellitus without complication ([place]) 84/66/5993    Past Surgical History:  Procedure Laterality Date  . INCISION AND DRAINAGE PERIRECTAL ABSCESS Left 10/26/2019   Procedure: INCISION AND DRAINAGE GROIN;  Surgeon: Donnie Mesa, MD;  Location: [facility];  Service: General;  Laterality: Left;     OB History   No obstetric history on file.     Family History  Problem Relation Age of Onset  . Diabetes Mother     Social History   Tobacco Use  . Smoking status: Never Smoker  . Smokeless tobacco: Never Used  Substance Use Topics  . Alcohol use: Never  . Drug use: Never    Home Medications Prior to Admission medications   Medication Sig Start Date End Date Taking?  Authorizing Provider  acetaminophen (TYLENOL) 325 MG tablet Take 2 tablets (650 mg total) by mouth every 6 (six) hours. Patient taking differently: Take 650 mg by mouth every 6 (six) hours as needed for mild pain, moderate pain, fever or headache.  11/02/19  Yes Earnstine Regal, PA-C  blood glucose meter kit and supplies Dispense based on patient and insurance preference. Use up to four times daily as directed. (FOR ICD-10 E10.9, E11.9). 11/01/19  Yes Oretha Milch D, MD  Insulin Isophane & Regular Human (NOVOLIN 70/30 FLEXPEN RELION) (70-30) 100 UNIT/ML PEN Inject 18 Units into the skin 2 (two) times daily. 11/01/19  Yes Oretha Milch D, MD  Insulin Pen Needle 31G X 5 MM MISC Use as directed for insulin pen 11/01/19  Yes Oretha Milch D, MD  multivitamin-iron-minerals-folic acid (CENTRUM) chewable tablet Chew 1 tablet by mouth daily. 11/02/19  Yes Earnstine Regal, PA-C  amoxicillin-clavulanate (AUGMENTIN) 875-125 MG tablet Take 1 tablet by mouth 2 (two) times daily with a meal. Patient not taking: Reported on 11/12/2019 11/02/19   Earnstine Regal, PA-C    Allergies    Patient has no known allergies.  Review of Systems   Review of Systems  Skin: Positive for wound.  All other systems reviewed and are negative.   Physical Exam Updated Vital Signs BP 124/89   Pulse 91   Temp 98 F (36.7 C)   Resp 16   LMP 10/16/2019 Comment: pregnancy waiver form signed 11-12-2019  SpO2 95%   Physical Exam Vitals and nursing note reviewed.  HENT:     Head: Normocephalic.     Right Ear: Tympanic membrane normal.     Nose: Nose normal.     Mouth/Throat:     Mouth: Mucous membranes are moist.  Eyes:     Extraocular Movements: Extraocular movements intact.     Pupils: Pupils are equal, round, and reactive to light.  Cardiovascular:     Rate and Rhythm: Normal rate and regular rhythm.     Pulses: Normal pulses.     Heart sounds: Normal heart sounds.  Pulmonary:     Effort: Pulmonary  effort is normal.     Breath sounds: Normal breath sounds.  Abdominal:     General: Abdomen is flat.     Palpations: Abdomen is soft.  Genitourinary:    Comments: L buttock wound draining serosanguinous drainage. + hematoma, unclear if there is fluctuance. There is some redness around the wound  Musculoskeletal:     Cervical back: Normal range of motion.  Skin:    General: Skin is warm.     Capillary Refill: Capillary refill takes less than 2 seconds.     Findings: Erythema present.  Neurological:     General: No focal deficit present.     Mental Status: She is alert and oriented to person, place, and time.  Psychiatric:        Mood and Affect: Mood normal.        Behavior: Behavior normal.     ED Results / Procedures / Treatments   Labs (all labs ordered are listed, but only abnormal results are displayed) Labs Reviewed  CBC WITH DIFFERENTIAL/PLATELET - Abnormal; Notable for the following components:      Result Value   Hemoglobin 10.4 (*)    HCT 35.4 (*)    MCH 24.5 (*)    MCHC 29.4 (*)    RDW 22.8 (*)    Platelets 492 (*)    All other components within normal limits  COMPREHENSIVE METABOLIC PANEL - Abnormal; Notable for the following components:   Glucose, Bld 214 (*)    Calcium 8.4 (*)    Albumin 2.9 (*)    AST 12 (*)    All other components within normal limits  I-STAT CHEM 8, ED - Abnormal; Notable for the following components:   Glucose, Bld 211 (*)    Hemoglobin 11.2 (*)    HCT 33.0 (*)    All other components within normal limits  CULTURE, BLOOD (ROUTINE X 2)  CULTURE, BLOOD (ROUTINE X 2)  LACTIC ACID, PLASMA  URINALYSIS, ROUTINE W REFLEX MICROSCOPIC    EKG None  Radiology CT ABDOMEN PELVIS W CONTRAST  Result Date: 11/12/2019 CLINICAL DATA:  Status post I and D of thigh abscess 10 days ago with ongoing bleeding. EXAM: CT ABDOMEN AND PELVIS WITH CONTRAST TECHNIQUE: Multidetector CT imaging of the abdomen and pelvis was performed using the standard  protocol following bolus administration of intravenous contrast. CONTRAST:  164m OMNIPAQUE IOHEXOL 300 MG/ML  SOLN COMPARISON:  10/30/2019 FINDINGS: Lower chest: Unremarkable. Hepatobiliary: No suspicious focal abnormality within the liver parenchyma. Calcified gallstones measure up to 3.4 cm diameter. No intrahepatic or extrahepatic biliary dilation. Pancreas: No focal mass lesion. No dilatation of the main duct. No intraparenchymal cyst. No peripancreatic edema. Spleen: No splenomegaly. No focal mass lesion. Adrenals/Urinary Tract: No adrenal nodule or mass. Kidneys unremarkable. No evidence for hydroureter. The urinary bladder appears normal for the degree of  distention. Stomach/Bowel: Stomach is distended with food and fluid. Duodenum is normally positioned as is the ligament of Treitz. No small bowel wall thickening. No small bowel dilatation. The terminal ileum is normal. The appendix is normal. No gross colonic mass. No colonic wall thickening. Vascular/Lymphatic: No abdominal aortic aneurysm. No abdominal aortic atherosclerotic calcification. There is no gastrohepatic or hepatoduodenal ligament lymphadenopathy. No retroperitoneal or mesenteric lymphadenopathy. No pelvic sidewall lymphadenopathy. Reproductive: The uterus is unremarkable.  There is no adnexal mass. Other: No intraperitoneal free fluid. Musculoskeletal: 3 x 7 x 8 cm rim enhancing collection of fluid and debris is identified in the proximal aspect of the medial phi with fluid and gas tracking up into the left groin region. This entire area was not imaged on the study from 10/30/2019 but was seen on the exam from 10/26/2019. Today's study shows a better organized rim enhancing collection of fluid and debris than the 10/26/2019 exam. Despite scanning into the proximal left thigh, the entire extent of this abscess has not been visualized. IMPRESSION: 1. 3 x 7 x 8 cm disklike rim enhancing collection of fluid and debris identified in the proximal  aspect of the medial left thigh, compatible with abscess. This is better organized than it was on the 10/26/2019 exam but the region was incompletely visualized on the 10/30/19 exam. The cranial portion of this abscess further dissects anteriorly up into the left groin region, up to about the level of the left pubic bone. This is similar to 10/30/2019. 2. Cholelithiasis. Electronically Signed   By: Misty Stanley M.D.   On: 11/12/2019 17:05    Procedures Procedures (including critical care time)  Medications Ordered in ED Medications  sodium chloride (PF) 0.9 % injection (has no administration in time range)  sodium chloride (PF) 0.9 % injection (has no administration in time range)  lidocaine-EPINEPHrine (XYLOCAINE W/EPI) 2 %-1:200000 (PF) injection (has no administration in time range)  sulfamethoxazole-trimethoprim (BACTRIM DS) 800-160 MG per tablet 1 tablet (has no administration in time range)  sodium chloride 0.9 % bolus 1,000 mL (0 mLs Intravenous Stopped 11/12/19 1454)  insulin aspart (novoLOG) injection 5 Units (5 Units Subcutaneous Given 11/12/19 1340)  iohexol (OMNIPAQUE) 300 MG/ML solution 100 mL (100 mLs Intravenous Contrast Given 11/12/19 1630)    ED Course  I have reviewed the triage vital signs and the nursing notes.  Pertinent labs & imaging results that were available during my care of the patient were reviewed by me and considered in my medical decision making (see chart for details).    MDM Rules/Calculators/A&P                     Silena Wyss is a 43 y.o. female here with left buttock hematoma vs abscess. Had recent necrotyzing fasciitis with surgery. Will do sepsis workup and get CT ab/pel to further assess.   7:19 PM CT showed hematoma vs abscess.  There is still some gas but that has improved .  I talked to Dr. Lucia Gaskins who saw the patient .  He felt that the patient clinically has a hematoma and he found a bleeding vessel which he sutured .  He recommend another  course of antibiotics and he recommended a course of Bactrim.  She will have follow-up in the clinic on Monday. Stable for discharge.   Final Clinical Impression(s) / ED Diagnoses Final diagnoses:  None    Rx / DC Orders ED Discharge Orders    None  Drenda Freeze, MD 11/12/19 Lurena Nida

## 2019-11-12 NOTE — Discharge Instructions (Addendum)
You have a hematoma.   Take bactrim twice daily for a week   See your surgeon on Monday as scheduled   Return to ER if you have worse abdominal pain, uncontrolled bleeding, fever, vomiting

## 2019-11-14 ENCOUNTER — Telehealth (HOSPITAL_BASED_OUTPATIENT_CLINIC_OR_DEPARTMENT_OTHER): Payer: Self-pay | Admitting: *Deleted

## 2019-11-14 NOTE — Telephone Encounter (Signed)
following up from day shift positive blood culture aerobic gram + cocci result that was called.  Dr. Rex Kras reviewed the chart and she request that I f/u with the patient to see how she is doing. If she is not improving she needs to return to [facility] ED for a re-check. I spoke with the patient. She speaks little [place]. I spoke with her daughter and she assisted with asking the patient questions. Pt states she is currently started back taking her antibiotics today 12-26. States she is overall feeling better. States wound has some bleeding with dressing change only. Pt's daughter states that pt has a f/u appointment with her surgeon on Monday morning. Instructed pt to be re-evaluated per the MD request if at any point she feels like she is getting worse. Pt's daughter voiced understanding of this and explained to the patient.

## 2019-11-15 LAB — CULTURE, BLOOD (ROUTINE X 2): Special Requests: ADEQUATE

## 2019-12-01 ENCOUNTER — Ambulatory Visit: Payer: Self-pay | Admitting: Family Medicine

## 2021-09-09 IMAGING — CT CT ABD-PELV W/ CM
2 of 5 series · 16 of 46 positions shown, 18 images · IV contrast (APPLIED)
Comparison: None.

CLINICAL DATA: Acute generalized abdominal pain

EXAM:
CT ABDOMEN AND PELVIS WITH CONTRAST
TECHNIQUE: Multidetector CT imaging of the abdomen and pelvis was performed
using the standard protocol following bolus administration of
intravenous contrast.
CONTRAST:  100mL OMNIPAQUE IOHEXOL 300 MG/ML  SOLN

[Series 3: abdomen 5.0 · axial · 0.94mm/px · z∈[+772,+1227]mm · 13 of 107 slices shown, 15 images]
[im 8/107  soft-tissue]
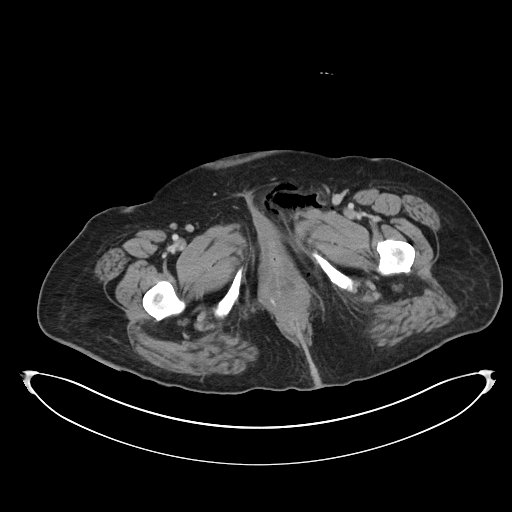
[im 8/107  bone]
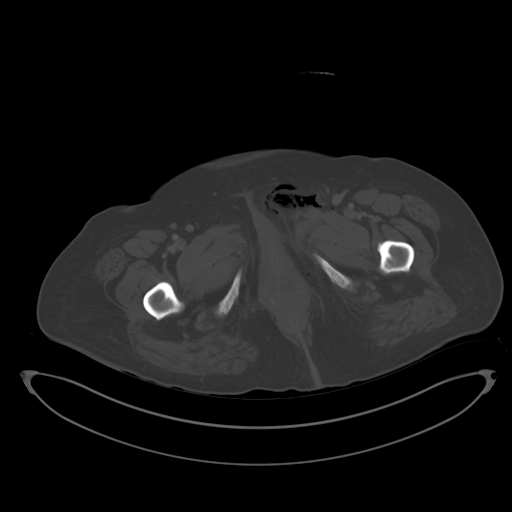
[im 15/107  soft-tissue]
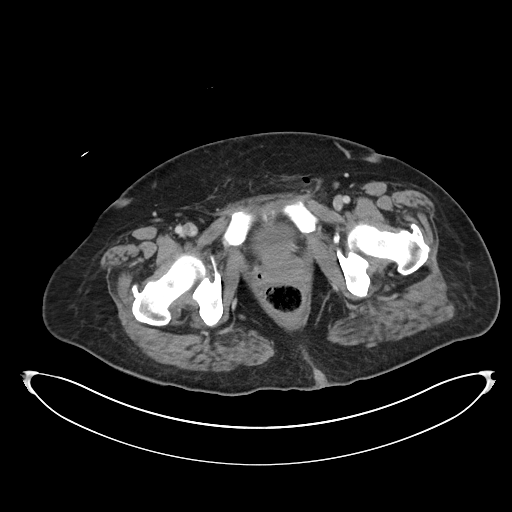
[im 22/107  soft-tissue]
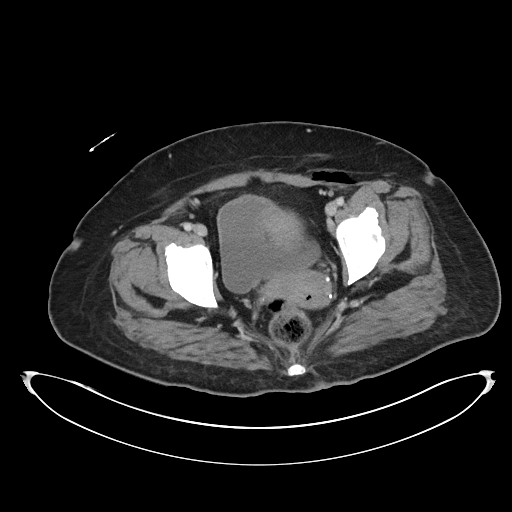
[im 29/107  soft-tissue]
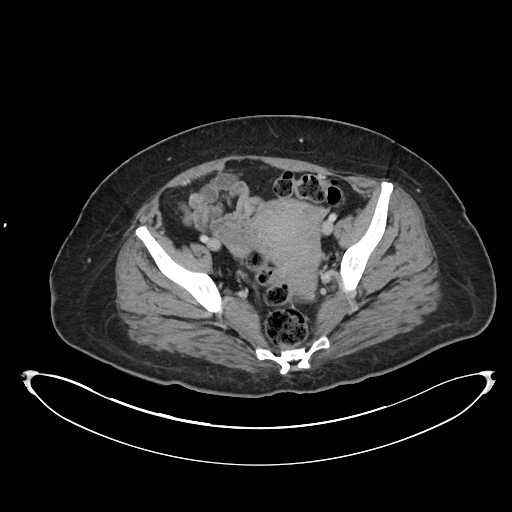
[im 36/107  soft-tissue]
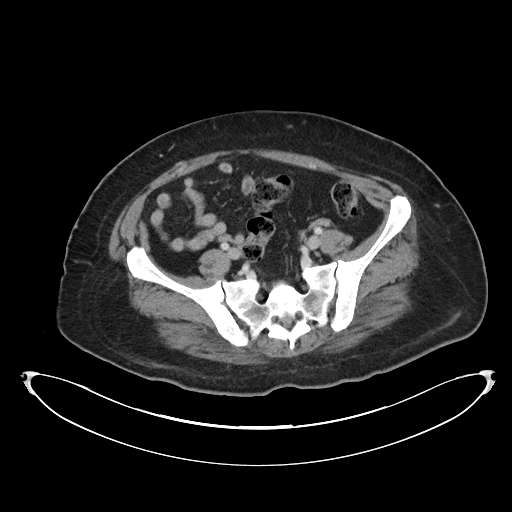
[im 43/107  soft-tissue]
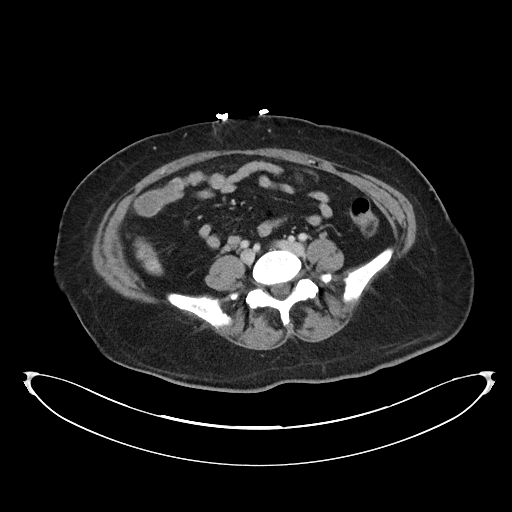
[im 57/107  soft-tissue]
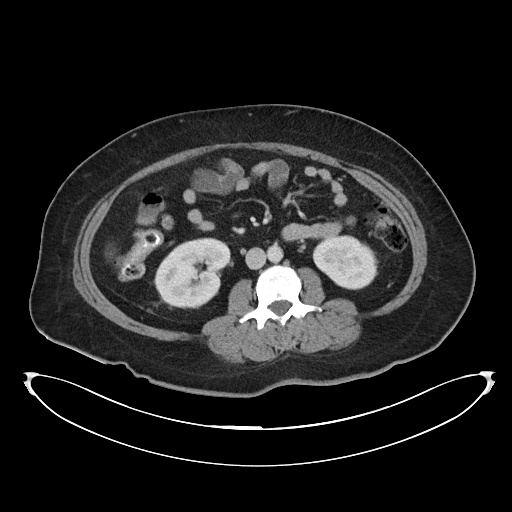
[im 64/107  soft-tissue]
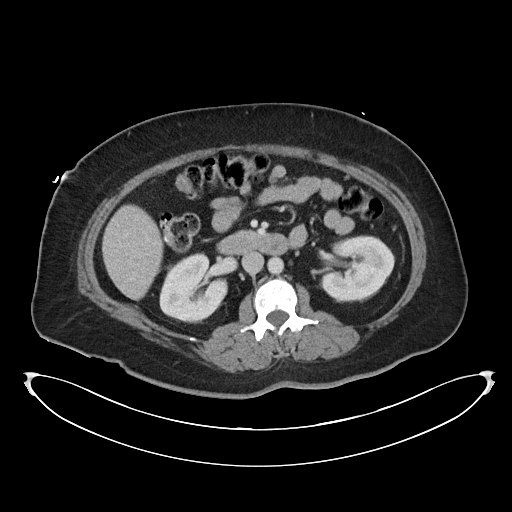
[im 71/107  soft-tissue]
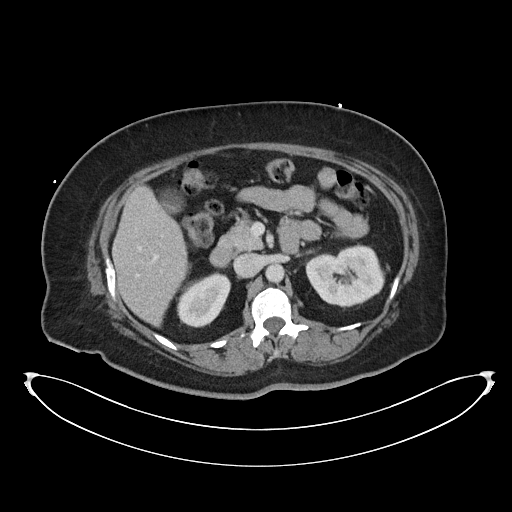
[im 71/107  bone]
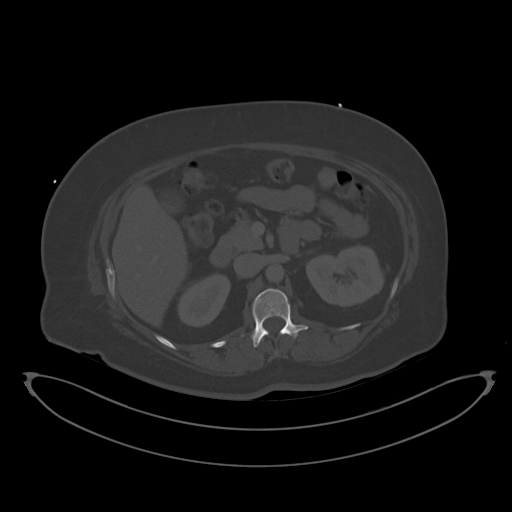
[im 78/107  soft-tissue]
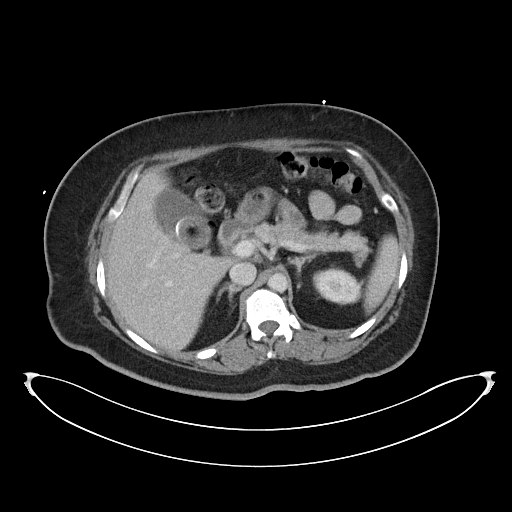
[im 85/107  soft-tissue]
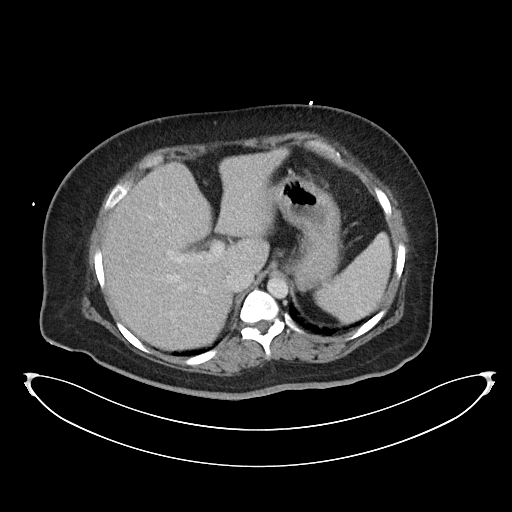
[im 92/107  soft-tissue]
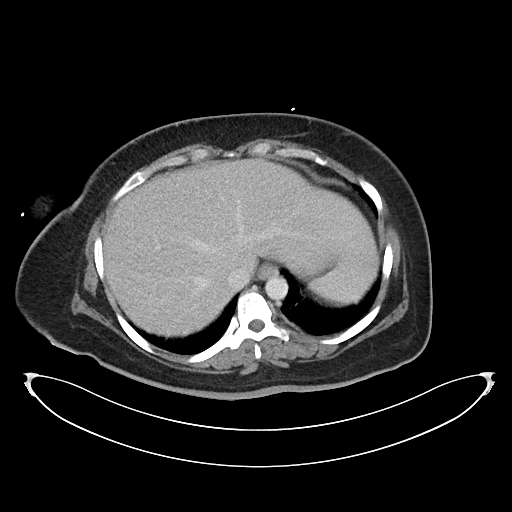
[im 99/107  soft-tissue]
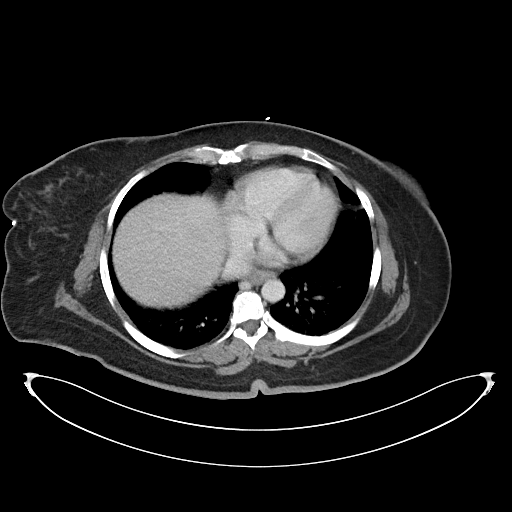

[Series 6: abdomen 3.0 mpr cor · coronal · 0.88mm/px · 3 of 96 slices shown]
[im 32/96  soft-tissue]
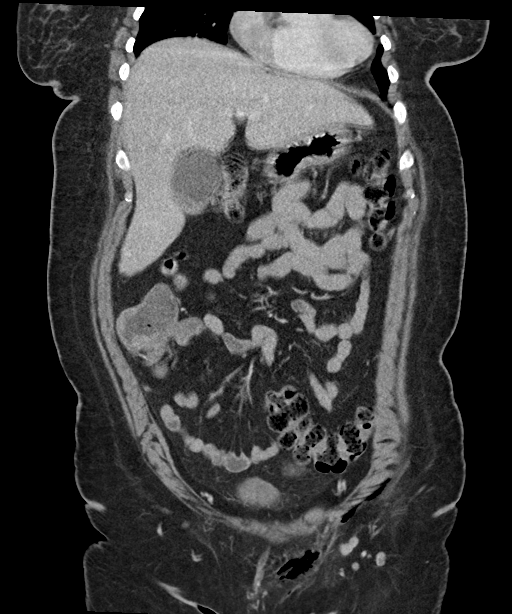
[im 43/96  soft-tissue]
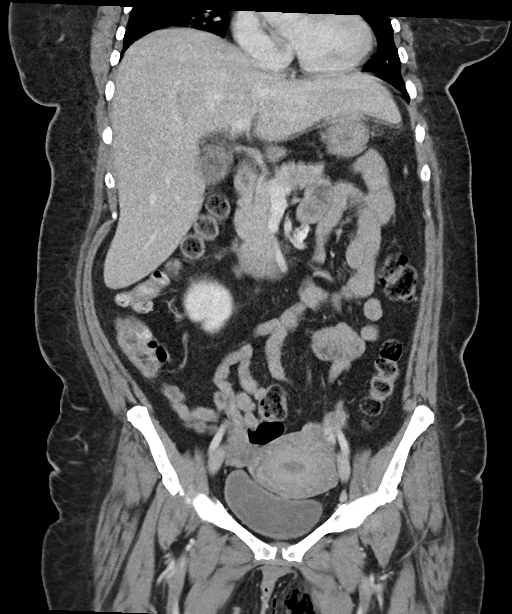
[im 53/96  soft-tissue]
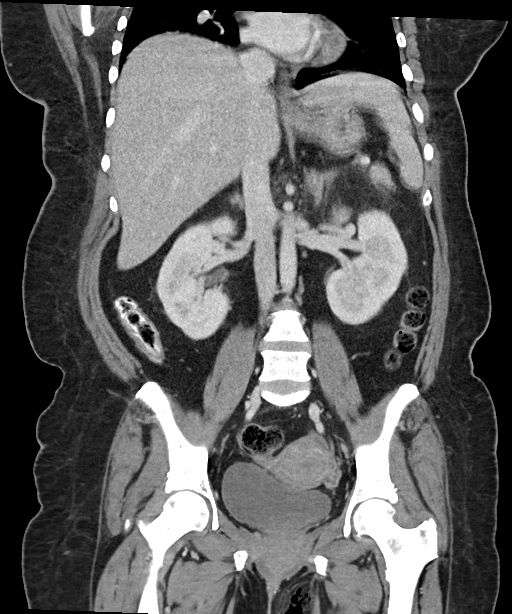

[16 of 46 positions shown; findings below may reference images not displayed]

FINDINGS: Lower chest:  No contributory findings.

Hepatobiliary: No focal liver abnormality.Cholelithiasis and
gallbladder sludge. No evidence of gallbladder inflammation. No bile
duct dilatation.

Pancreas: Unremarkable.

Spleen: Unremarkable.

Adrenals/Urinary Tract: Negative adrenals. No hydronephrosis or
stone. Unremarkable bladder.

Stomach/Bowel:  No obstruction. No appendicitis.

Vascular/Lymphatic: No acute vascular abnormality. No mass or
adenopathy.

Reproductive:No pathologic findings.

Other: No ascites or pneumoperitoneum.

Soft tissue gas in the visualized left deep groin fat with tracking
along the inguinal canal and into the mons pubis/labia, incompletely
covered. No organized collection.

Musculoskeletal: No acute abnormalities.

These results were called by telephone at the time of interpretation
on 10/26/2019 at [DATE] to provider OLIGERS MANUELI , who verbally
acknowledged these results.
IMPRESSION: 1. Partially covered soft tissue gas in the deep fat of the left
groin and perineum compatible with necrotizing infection. No
visualized abscess ; there is incomplete coverage.
2. Cholelithiasis without signs of cholecystitis.

## 2021-09-13 IMAGING — CT CT ABD-PELV W/ CM
2 of 5 series · 16 of 46 positions shown, 18 images · IV contrast (omnipaque)
Comparison: 10/26/2019

CLINICAL DATA: Anemia. Pt is 4 days post-op. Looking for possible
source of bleeding that is causing anemia. Incision and drainage
perirectal abscess on 10/26/2019.

EXAM:
CT ABDOMEN AND PELVIS WITH CONTRAST
TECHNIQUE: Multidetector CT imaging of the abdomen and pelvis was performed
using the standard protocol following bolus administration of
intravenous contrast.
CONTRAST:  100mL OMNIPAQUE IOHEXOL 300 MG/ML  SOLN

[Series 3: a/p w/ 5mm · axial · 0.92mm/px · z∈[+848,+1284]mm · 13 of 99 slices shown, 15 images]
[im 6/99  soft-tissue]
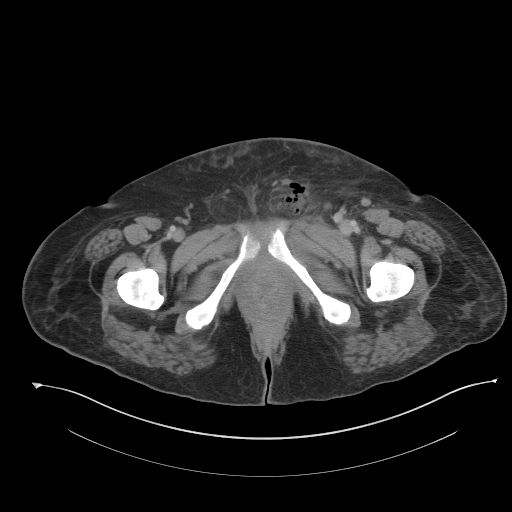
[im 6/99  bone]
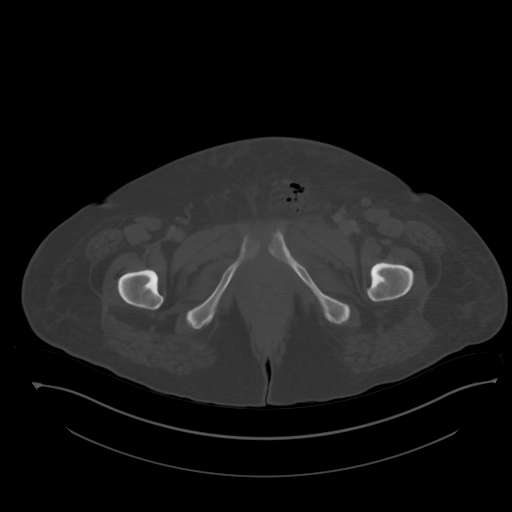
[im 11/99  soft-tissue]
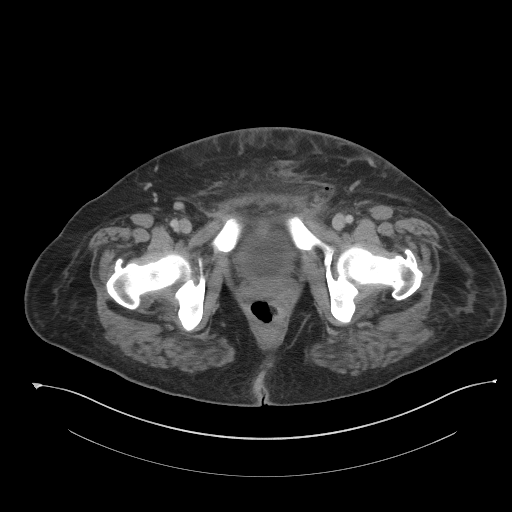
[im 22/99  soft-tissue]
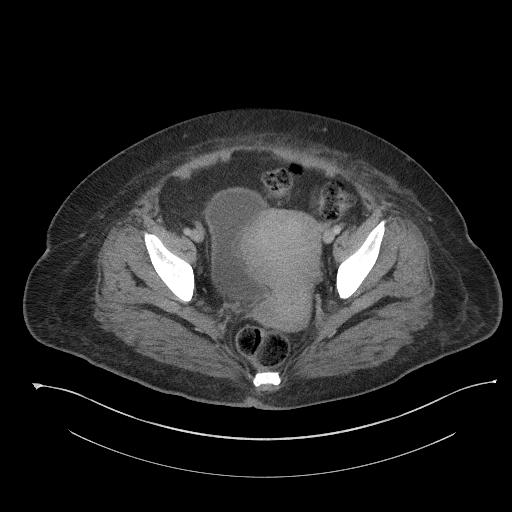
[im 28/99  soft-tissue]
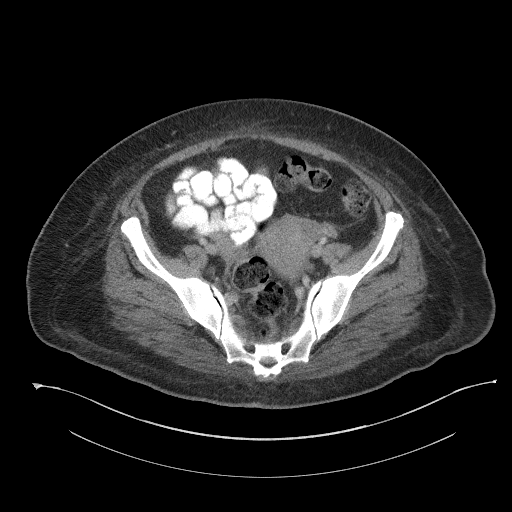
[im 33/99  soft-tissue]
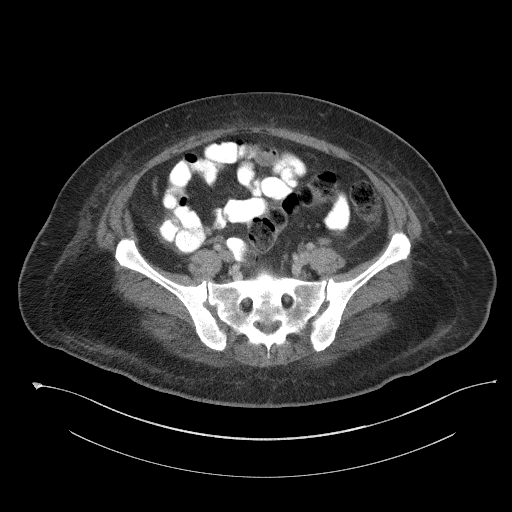
[im 44/99  soft-tissue]
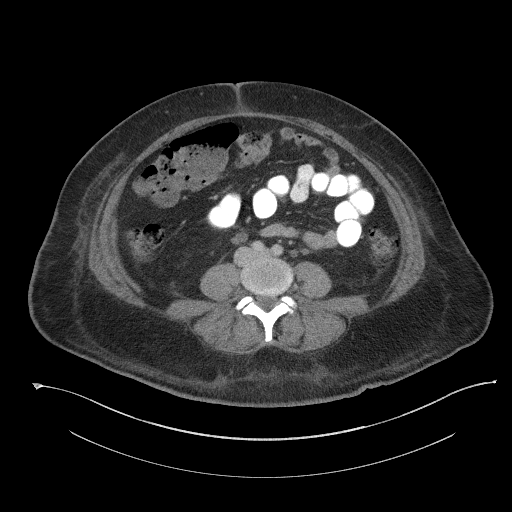
[im 50/99  soft-tissue]
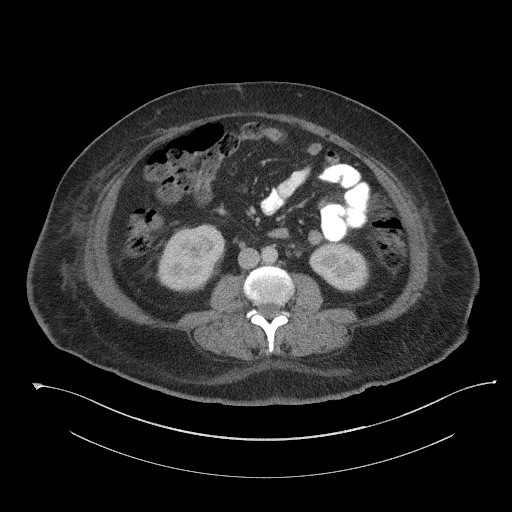
[im 55/99  soft-tissue]
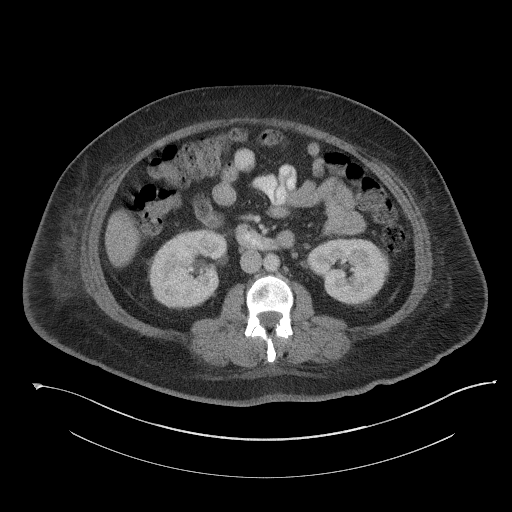
[im 66/99  soft-tissue]
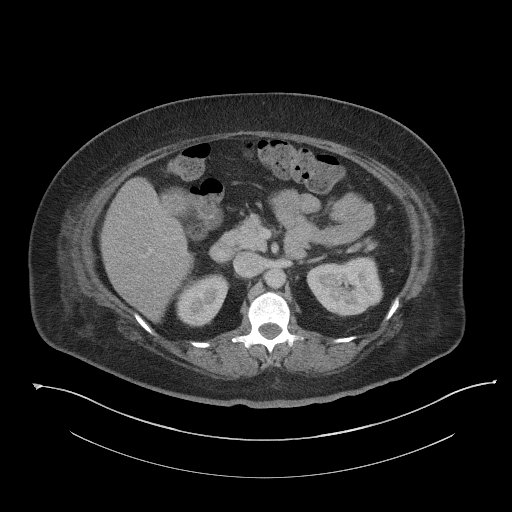
[im 66/99  bone]
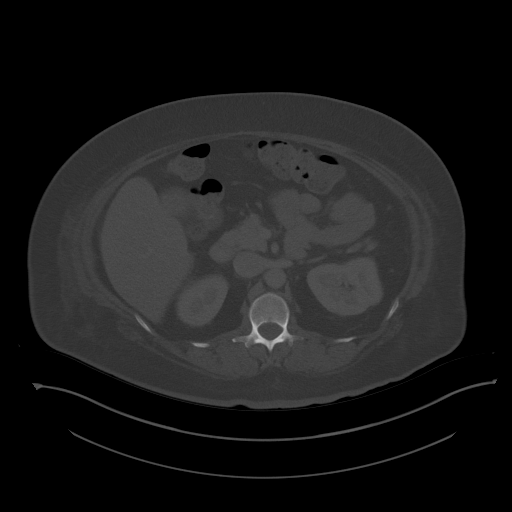
[im 71/99  soft-tissue]
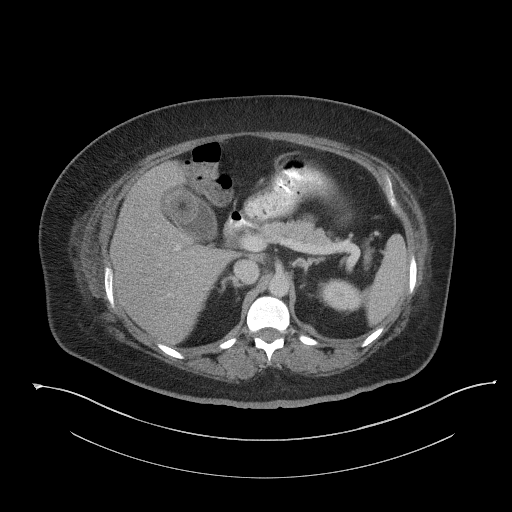
[im 77/99  soft-tissue]
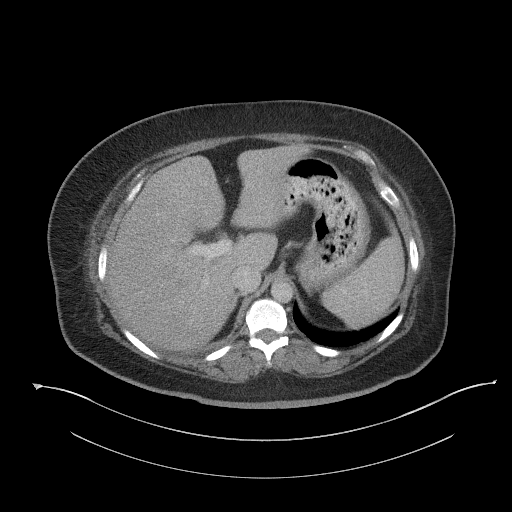
[im 88/99  soft-tissue]
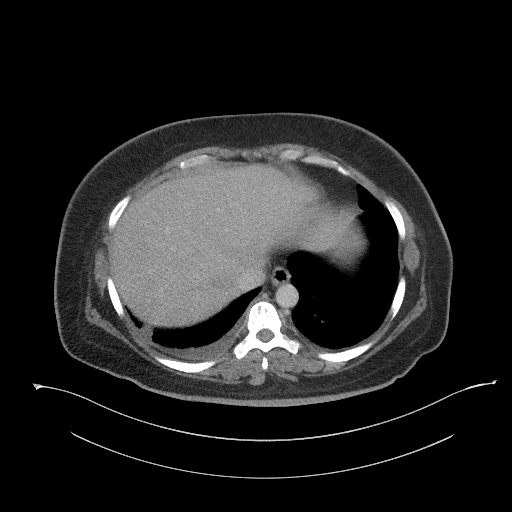
[im 93/99  soft-tissue]
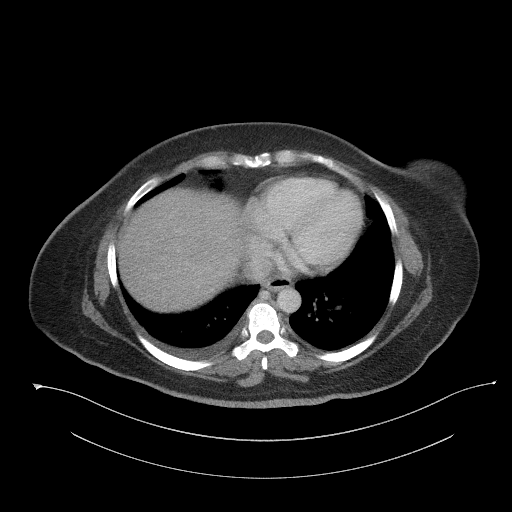

[Series 6: a/p w/ cor · coronal · 0.84mm/px · 3 of 138 slices shown]
[im 46/138  soft-tissue]
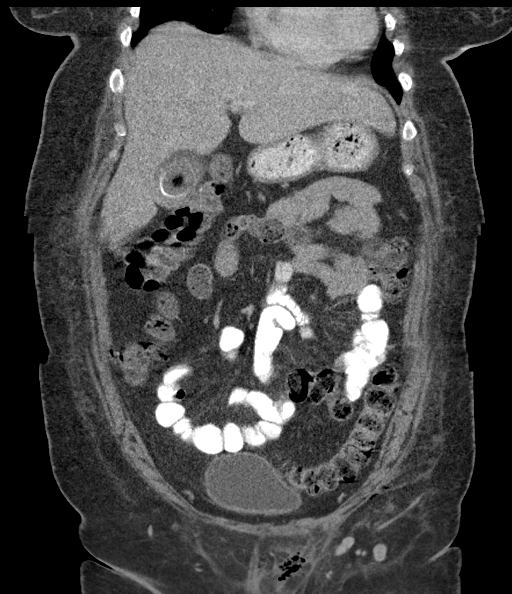
[im 61/138  soft-tissue]
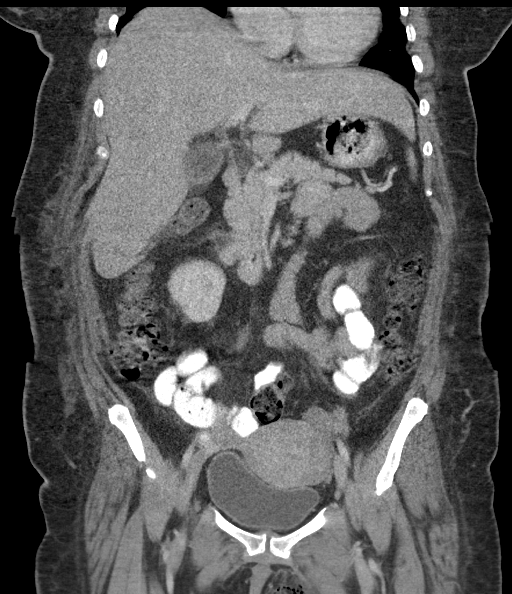
[im 77/138  soft-tissue]
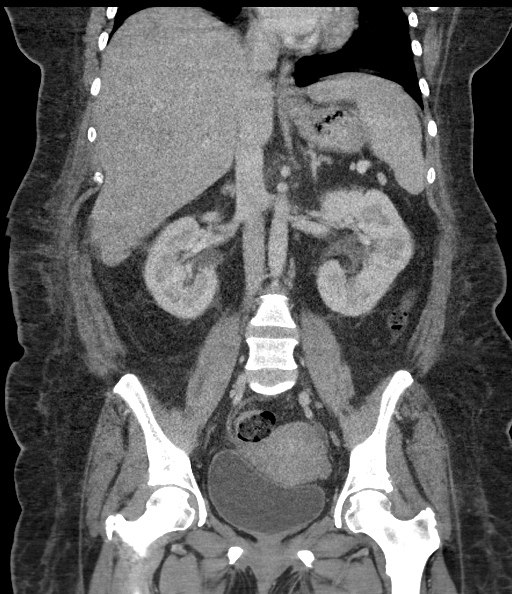

[16 of 46 positions shown; findings below may reference images not displayed]

FINDINGS: Lower chest: Small right pleural effusion. Minor dependent lung base
atelectasis. Heart normal in size.

Hepatobiliary: Liver unremarkable. Large gallstone, unchanged. No
evidence of acute cholecystitis. No bile duct dilation.

Pancreas: Unremarkable. No pancreatic ductal dilatation or
surrounding inflammatory changes.

Spleen: Normal in size without focal abnormality.

Adrenals/Urinary Tract: No adrenal masses.

Kidneys normal size, orientation and position with symmetric
enhancement and excretion. No masses, stones or hydronephrosis.
Normal ureters. Normal bladder.

Stomach/Bowel: Stomach is unremarkable. Small bowel and colon are
normal in caliber. No wall thickening or inflammation. No evidence
of a bowel mass. Normal appendix visualized.

Vascular/Lymphatic: No significant vascular abnormality. No
lymphadenopathy.

Reproductive: Soft tissue air tracks along the left groin and
inguinal canal, less extensive than on the prior CT. No defined
fluid collection to suggest an abscess.

Uterus and adnexa are unremarkable.

Other: No evidence of retroperitoneal or intraperitoneal or pelvic
hemorrhage.

Musculoskeletal: No acute or significant osseous findings.
IMPRESSION: 1. No findings to account for anemia. No evidence of hemorrhage and
no bowel mass or inflammation.
2. Soft tissue air along the left inguinal region, partly imaged,
with less air than noted on the prior CT. No fluid collection to
suggest an abscess.
3. Gallstone without evidence of acute cholecystitis.
4. Small right pleural effusion new since the prior CT.
